# Patient Record
Sex: Female | Born: 1950 | Race: White | Hispanic: No | Smoking: Former smoker
Health system: Southern US, Community
[De-identification: ages and names within clinical notes are randomized; demographics above are authoritative.]

## PROBLEM LIST (undated history)

## (undated) DIAGNOSIS — F329 Major depressive disorder, single episode, unspecified: Secondary | ICD-10-CM

## (undated) DIAGNOSIS — K219 Gastro-esophageal reflux disease without esophagitis: Secondary | ICD-10-CM

## (undated) DIAGNOSIS — I10 Essential (primary) hypertension: Secondary | ICD-10-CM

## (undated) DIAGNOSIS — E119 Type 2 diabetes mellitus without complications: Secondary | ICD-10-CM

## (undated) DIAGNOSIS — F419 Anxiety disorder, unspecified: Secondary | ICD-10-CM

## (undated) DIAGNOSIS — E669 Obesity, unspecified: Secondary | ICD-10-CM

## (undated) DIAGNOSIS — Z720 Tobacco use: Secondary | ICD-10-CM

## (undated) DIAGNOSIS — F32A Depression, unspecified: Secondary | ICD-10-CM

## (undated) DIAGNOSIS — J45909 Unspecified asthma, uncomplicated: Secondary | ICD-10-CM

## (undated) HISTORY — DX: Unspecified asthma, uncomplicated: J45.909

## (undated) HISTORY — DX: Tobacco use: Z72.0

## (undated) HISTORY — DX: Gastro-esophageal reflux disease without esophagitis: K21.9

## (undated) HISTORY — DX: Obesity, unspecified: E66.9

## (undated) HISTORY — DX: Major depressive disorder, single episode, unspecified: F32.9

## (undated) HISTORY — DX: Essential (primary) hypertension: I10

## (undated) HISTORY — DX: Type 2 diabetes mellitus without complications: E11.9

## (undated) HISTORY — PX: TUBAL LIGATION: SHX77

## (undated) HISTORY — DX: Depression, unspecified: F32.A

## (undated) HISTORY — PX: TONSILLECTOMY: SHX5217

---

## 2001-03-20 ENCOUNTER — Other Ambulatory Visit: Admission: RE | Admit: 2001-03-20 | Discharge: 2001-03-20 | Payer: Self-pay | Admitting: Obstetrics and Gynecology

## 2001-05-12 ENCOUNTER — Ambulatory Visit (HOSPITAL_BASED_OUTPATIENT_CLINIC_OR_DEPARTMENT_OTHER): Admission: RE | Admit: 2001-05-12 | Discharge: 2001-05-12 | Payer: Self-pay | Admitting: Internal Medicine

## 2002-06-01 ENCOUNTER — Other Ambulatory Visit: Admission: RE | Admit: 2002-06-01 | Discharge: 2002-06-01 | Payer: Self-pay | Admitting: Obstetrics and Gynecology

## 2003-09-09 ENCOUNTER — Other Ambulatory Visit: Admission: RE | Admit: 2003-09-09 | Discharge: 2003-09-09 | Payer: Self-pay | Admitting: Obstetrics and Gynecology

## 2007-09-18 ENCOUNTER — Encounter: Admission: RE | Admit: 2007-09-18 | Discharge: 2007-09-18 | Payer: Self-pay | Admitting: Internal Medicine

## 2007-10-28 ENCOUNTER — Ambulatory Visit: Payer: Self-pay | Admitting: Internal Medicine

## 2007-11-14 ENCOUNTER — Ambulatory Visit: Payer: Self-pay | Admitting: Internal Medicine

## 2007-11-14 ENCOUNTER — Encounter: Payer: Self-pay | Admitting: Internal Medicine

## 2010-12-05 NOTE — Assessment & Plan Note (Signed)
[facility] OFFICE NOTE   NAME:Enderle, Stepahnie B                       MRN:          [id number]  DATE:10/28/2007                            DOB:          03-22-1951    REASON FOR CONSULTATION:  1. Chronic reflux disease.  2. Colon cancer screening.   HISTORY:  This is a pleasant 60 year old white female with history of  hypertension, diabetes mellitus, and sleep apnea. She is referred  through the courtesy of Dr. Reynaldo Minium regarding the above-listed problems.  The patient reports to me 5-10 year history of problems with heartburn  and indigestion. Initially she took antacids, subsequently H2 receptor  antagonists and most recently proton pump inhibitors. Her symptoms are  daily and bothersome at night when she is on no therapy. She denies  dysphagia. She has had no nausea or vomiting while on medication. She  denies abdominal pain, constipation, or diarrhea. Her bowel habits have  been regular without bleeding. No melena. Appetite and weight have been  stable.   PAST MEDICAL HISTORY:  1. Hypertension.  2. Type 2 diabetes.  3. Sleep apnea.  4. History of panic disorder.   PAST SURGICAL HISTORY:  Tubal ligation.   ALLERGIES:  1. SULFA.  2. CODEINE.   CURRENT MEDICATIONS:  1. Atenolol 15 mg daily.  2. Zoloft 100 mg daily.  3. Mirapex  25 mg daily.  4. Protonix 40 mg daily.  5. Glyburide/Metformin 5/500 mg b.i.d.  6. Aspirin 325 mg daily.   FAMILY HISTORY:  No family history of colon cancer or colon polyps.   SOCIAL HISTORY:  The patient is married without children. She lives with  her husband. 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 She smokes 1/2-pack of cigarettes per day. Does not use  alcohol.   REVIEW OF SYSTEMS:  Eyeglasses, sleeping problems, and muscle pains.  Otherwise, negative review of systems as per diagnostic evaluation form.   PHYSICAL EXAMINATION:   GENERAL:  Somewhat unhealthy-appearing female who  is in no acute distress. She is alert and oriented x3.  VITAL SIGNS:  Blood pressure is 130/84. Heart rate is 60 and regular.  Respirations are 18 and regular. Her weight is 206.6 pounds. She is 5  feet 5 inches in height.  HEENT:  Sclerae are anicteric. Conjunctivae are pink. Oral mucosa is  intact. There is no adenopathy. Thyroid is normal.  LUNGS:  Clear to auscultation and percussion.  HEART:  Regular without murmur.  ABDOMEN:  Obese and soft without tenderness, mass, or hernia. No  organomegaly. Good bowel sounds are heard.  EXTREMITIES:  Without edema.   IMPRESSION:  1. Chronic gastroesophageal reflux disease requiring proton pump      inhibitor therapy for control. This in an obese, caucasian smoker.      Rule out Barrett's esophagus.  2. Colon cancer screening. The patient is an appropriate candidate      without contraindication.   RECOMMENDATIONS:  1. Colonoscopy with polypectomy if necessary and upper endoscopy. The      nature of both procedures as well  as risks, benefits, and      alternatives have been reviewed. She understood and agreed to      proceed.  2. Continue daily proton pump inhibitor therapy.  3. Reflux precautions with attention to weight loss.  4. Discontinue smoking.  5. Ongoing general medical care with Dr. Reynaldo Minium.     Docia Chuck. Henrene Pastor, MD  Electronically Signed    JNP/MedQ  DD: 10/28/2007  DT: 10/28/2007  Job #: [id number]   cc:   Burnard Bunting, M.D.

## 2013-01-09 ENCOUNTER — Ambulatory Visit (INDEPENDENT_AMBULATORY_CARE_PROVIDER_SITE_OTHER): Payer: BC Managed Care – PPO | Admitting: Obstetrics and Gynecology

## 2013-01-09 ENCOUNTER — Encounter: Payer: Self-pay | Admitting: Obstetrics and Gynecology

## 2013-01-09 VITALS — BP 130/76 | HR 76 | Ht 64.0 in | Wt 199.0 lb

## 2013-01-09 DIAGNOSIS — Z1231 Encounter for screening mammogram for malignant neoplasm of breast: Secondary | ICD-10-CM

## 2013-01-09 DIAGNOSIS — Z01419 Encounter for gynecological examination (general) (routine) without abnormal findings: Secondary | ICD-10-CM

## 2013-01-09 NOTE — Progress Notes (Signed)
Patient ID: Amy Herrera, female   DOB: 06/04/1951, 62 y.o.   MRN: [id number] 61 y.o.   Widowed    Caucasian   female   G1P1000   here for annual exam.   LMP 2009.   Having sweats but can deal with it.   No vaginal dryness issues.    No postmenopausal bleeding  Sleep quality not good.  Wakes often during the night.   Has sleep apnea.  Doesn't use CPAP machine.    Some urinary incontinence. Leaks with sneezing.  Wears a pad. Manageable.   Normal bowel function.  No LMP recorded. Patient is postmenopausal.          Sexually active: no  The current method of family planning is none.    Exercising: no Last mammogram: ~10 years  Last pap smear: 2009? History of abnormal pap: no  Smoking: current smoker, 1/2 ppd.  Not ready to stop.   Alcohol: none Last colonoscopy: 11/14/07, polyps, repeat in 5 years Last Bone Density:  10/14/07   Last tetanus shot: 12/31/08 TDap Last cholesterol check: 12/05/12  Hgb:  PCP        Urine:   PCP   Family History  Problem Relation Age of Onset  . Cancer Mother     lung  . Heart attack Father   . Heart disease Sister   . Muscular dystrophy Cousin   . Muscular dystrophy Cousin   . Muscular dystrophy Cousin     There are no active problems to display for this patient.   Past Medical History  Diagnosis Date  . Tobacco abuse   . Obesity   . Asthma   . Depression   . Type II or unspecified type diabetes mellitus without mention of complication, not stated as uncontrolled   . GERD (gastroesophageal reflux disease)   . Hypertension     Past Surgical History  Procedure Laterality Date  . Tubal ligation    . Tonsillectomy      Allergies: Sulfa antibiotics and Codeine  Current Outpatient Prescriptions  Medication Sig Dispense Refill  . amLODipine (NORVASC) 5 MG tablet Take 5 mg by mouth daily.      Marland Kitchen aspirin 325 MG EC tablet Take 325 mg by mouth daily.      Marland Kitchen atenolol (TENORMIN) 50 MG tablet Take 50 mg by mouth daily.      Marland Kitchen atorvastatin  (LIPITOR) 20 MG tablet Take 1 tablet by mouth daily.      . betamethasone dipropionate (DIPROLENE) 0.05 % cream Apply topically 2 (two) times daily.      Marland Kitchen glyBURIDE-metformin (GLUCOVANCE) 5-500 MG per tablet Take 2 tablets by mouth 2 (two) times daily with a meal.      . lisinopril-hydrochlorothiazide (PRINZIDE,ZESTORETIC) 20-12.5 MG per tablet Take 1 tablet by mouth daily.      . pantoprazole (PROTONIX) 40 MG tablet Take 40 mg by mouth daily.      . pramipexole (MIRAPEX) 0.5 MG tablet Take 1 tablet by mouth daily.      . sertraline (ZOLOFT) 100 MG tablet Take 100 mg by mouth daily.       No current facility-administered medications for this visit.    ROS: Pertinent items are noted in HPI.  Social Hx:  Widow. Works at [employer].  Exam:    BP 130/76  Pulse 76  Ht 5\' 4"  (1.626 m)  Wt 199 lb (90.266 kg)  BMI 34.14 kg/m2   Wt Readings from  Last 3 Encounters:  01/09/13 199 lb (90.266 kg)     Ht Readings from Last 3 Encounters:  01/09/13 5\' 4"  (1.626 m)    General appearance: alert, cooperative and appears stated age Head: Normocephalic, without obvious abnormality, atraumatic Neck: no adenopathy, supple, symmetrical, trachea midline and thyroid not enlarged, symmetric, no tenderness/mass/nodules Lungs: clear to auscultation bilaterally Breasts: Inspection negative, No nipple retraction or dimpling, No nipple discharge or bleeding, No axillary or supraclavicular adenopathy, Normal to palpation without dominant masses Heart: regular rate and rhythm Abdomen: obese, soft, non-tender;  no masses,  no organomegaly Extremities: extremities normal, atraumatic, no cyanosis or edema Skin: Skin color, texture, turgor normal. No rashes or lesions Lymph nodes: Cervical, supraclavicular, and axillary nodes normal. No abnormal inguinal nodes palpated Neurologic: Grossly normal   Pelvic: External genitalia:  no lesions              Urethra:  normal appearing  urethra with no masses, tenderness or lesions              Bartholins and Skenes: normal                 Vagina: normal appearing vagina with normal color and discharge, no lesions              Cervix: normal appearance              Pap taken: yes and high risk HPV testing        Bimanual Exam:  Uterus:  uterus is normal size, shape, consistency and nontender                                      Adnexa: normal adnexa in size, nontender and no masses                                      Rectovaginal: Confirms                                      Anus:  normal sphincter tone, no lesions  A: normal menopausal exam     [facility]  Order placed.  Patient will call to make appointment. pap smear and high risk HPV testing. return annually or prn     An After Visit Summary was printed and given to the patient.

## 2013-01-09 NOTE — Patient Instructions (Addendum)

## 2013-01-10 ENCOUNTER — Encounter: Payer: Self-pay | Admitting: Obstetrics and Gynecology

## 2013-01-14 ENCOUNTER — Telehealth: Payer: Self-pay

## 2013-01-14 NOTE — Telephone Encounter (Signed)
Message copied by Lowella Fairy on Wed Jan 14, 2013  1:32 PM ------      Message from: Josefa Half      Created: Wed Jan 14, 2013  1:04 PM       Please note normal result. ------

## 2013-01-14 NOTE — Telephone Encounter (Signed)
LMOVM to call for test results.

## 2013-01-21 NOTE — Telephone Encounter (Signed)
Patient notified pap normal and HPV negative.

## 2013-12-18 ENCOUNTER — Other Ambulatory Visit: Payer: Self-pay | Admitting: Internal Medicine

## 2013-12-18 DIAGNOSIS — Z1231 Encounter for screening mammogram for malignant neoplasm of breast: Secondary | ICD-10-CM

## 2014-01-15 ENCOUNTER — Encounter: Payer: Self-pay | Admitting: Obstetrics and Gynecology

## 2014-01-15 ENCOUNTER — Ambulatory Visit: Payer: BC Managed Care – PPO | Admitting: Obstetrics and Gynecology

## 2014-02-16 ENCOUNTER — Ambulatory Visit: Payer: Self-pay

## 2014-04-19 ENCOUNTER — Encounter: Payer: Self-pay | Admitting: Internal Medicine

## 2014-04-21 ENCOUNTER — Encounter (HOSPITAL_COMMUNITY): Payer: Self-pay | Admitting: Emergency Medicine

## 2014-04-21 ENCOUNTER — Emergency Department (HOSPITAL_COMMUNITY)
Admission: EM | Admit: 2014-04-21 | Discharge: 2014-04-21 | Disposition: A | Discharge status: Home / Self Care | Payer: BC Managed Care – PPO | Attending: Emergency Medicine | Admitting: Emergency Medicine

## 2014-04-21 ENCOUNTER — Emergency Department (HOSPITAL_COMMUNITY): Payer: BC Managed Care – PPO

## 2014-04-21 DIAGNOSIS — Z7982 Long term (current) use of aspirin: Secondary | ICD-10-CM | POA: Diagnosis not present

## 2014-04-21 DIAGNOSIS — F329 Major depressive disorder, single episode, unspecified: Secondary | ICD-10-CM | POA: Insufficient documentation

## 2014-04-21 DIAGNOSIS — Z79899 Other long term (current) drug therapy: Secondary | ICD-10-CM | POA: Diagnosis not present

## 2014-04-21 DIAGNOSIS — R002 Palpitations: Secondary | ICD-10-CM | POA: Insufficient documentation

## 2014-04-21 DIAGNOSIS — K219 Gastro-esophageal reflux disease without esophagitis: Secondary | ICD-10-CM | POA: Diagnosis not present

## 2014-04-21 DIAGNOSIS — F411 Generalized anxiety disorder: Secondary | ICD-10-CM | POA: Diagnosis not present

## 2014-04-21 DIAGNOSIS — I10 Essential (primary) hypertension: Secondary | ICD-10-CM | POA: Diagnosis not present

## 2014-04-21 DIAGNOSIS — F3289 Other specified depressive episodes: Secondary | ICD-10-CM | POA: Diagnosis not present

## 2014-04-21 DIAGNOSIS — R000 Tachycardia, unspecified: Secondary | ICD-10-CM | POA: Insufficient documentation

## 2014-04-21 DIAGNOSIS — R079 Chest pain, unspecified: Secondary | ICD-10-CM | POA: Diagnosis not present

## 2014-04-21 DIAGNOSIS — E860 Dehydration: Secondary | ICD-10-CM

## 2014-04-21 DIAGNOSIS — E119 Type 2 diabetes mellitus without complications: Secondary | ICD-10-CM | POA: Insufficient documentation

## 2014-04-21 DIAGNOSIS — N179 Acute kidney failure, unspecified: Secondary | ICD-10-CM | POA: Diagnosis not present

## 2014-04-21 DIAGNOSIS — E669 Obesity, unspecified: Secondary | ICD-10-CM | POA: Diagnosis not present

## 2014-04-21 DIAGNOSIS — J45901 Unspecified asthma with (acute) exacerbation: Secondary | ICD-10-CM | POA: Insufficient documentation

## 2014-04-21 DIAGNOSIS — F17200 Nicotine dependence, unspecified, uncomplicated: Secondary | ICD-10-CM | POA: Insufficient documentation

## 2014-04-21 DIAGNOSIS — R0602 Shortness of breath: Secondary | ICD-10-CM

## 2014-04-21 HISTORY — DX: Anxiety disorder, unspecified: F41.9

## 2014-04-21 LAB — COMPREHENSIVE METABOLIC PANEL
ALBUMIN: 3.2 g/dL — AB (ref 3.5–5.2)
ALT: 28 U/L (ref 0–35)
ANION GAP: 15 (ref 5–15)
AST: 23 U/L (ref 0–37)
Alkaline Phosphatase: 71 U/L (ref 39–117)
BUN: 28 mg/dL — AB (ref 6–23)
CHLORIDE: 100 meq/L (ref 96–112)
CO2: 21 mEq/L (ref 19–32)
CREATININE: 2.28 mg/dL — AB (ref 0.50–1.10)
Calcium: 9.4 mg/dL (ref 8.4–10.5)
GFR calc Af Amer: 25 mL/min — ABNORMAL LOW (ref >90)
GFR calc non Af Amer: 22 mL/min — ABNORMAL LOW (ref >90)
Glucose, Bld: 302 mg/dL — ABNORMAL HIGH (ref 70–99)
POTASSIUM: 4.3 meq/L (ref 3.7–5.3)
Sodium: 136 mEq/L — ABNORMAL LOW (ref 137–147)
TOTAL PROTEIN: 7.1 g/dL (ref 6.0–8.3)
Total Bilirubin: <0.2 mg/dL — ABNORMAL LOW (ref 0.3–1.2)

## 2014-04-21 LAB — CBC WITH DIFFERENTIAL/PLATELET
BASOS ABS: 0.1 10*3/uL (ref 0.0–0.1)
BASOS PCT: 1 % (ref 0–1)
EOS ABS: 0.2 10*3/uL (ref 0.0–0.7)
Eosinophils Relative: 2 % (ref 0–5)
HCT: 34.4 % — ABNORMAL LOW (ref 36.0–46.0)
HEMOGLOBIN: 11.3 g/dL — AB (ref 12.0–15.0)
Lymphocytes Relative: 27 % (ref 12–46)
Lymphs Abs: 1.9 10*3/uL (ref 0.7–4.0)
MCH: 28.2 pg (ref 26.0–34.0)
MCHC: 32.8 g/dL (ref 30.0–36.0)
MCV: 85.8 fL (ref 78.0–100.0)
MONO ABS: 0.3 10*3/uL (ref 0.1–1.0)
MONOS PCT: 5 % (ref 3–12)
NEUTROS ABS: 4.7 10*3/uL (ref 1.7–7.7)
NEUTROS PCT: 65 % (ref 43–77)
Platelets: 285 10*3/uL (ref 150–400)
RBC: 4.01 MIL/uL (ref 3.87–5.11)
RDW: 14.7 % (ref 11.5–15.5)
WBC: 7.1 10*3/uL (ref 4.0–10.5)

## 2014-04-21 LAB — I-STAT TROPONIN, ED
TROPONIN I, POC: 0.01 ng/mL (ref 0.00–0.08)
TROPONIN I, POC: 0.02 ng/mL (ref 0.00–0.08)

## 2014-04-21 LAB — D-DIMER, QUANTITATIVE (NOT AT ARMC): D DIMER QUANT: 0.4 ug{FEU}/mL (ref 0.00–0.48)

## 2014-04-21 LAB — PRO B NATRIURETIC PEPTIDE: PRO B NATRI PEPTIDE: 177.2 pg/mL — AB (ref 0–125)

## 2014-04-21 MED ORDER — SODIUM CHLORIDE 0.9 % IV BOLUS (SEPSIS)
1000.0000 mL | Freq: Once | INTRAVENOUS | Status: AC
  Administered 2014-04-21: 1000 mL via INTRAVENOUS

## 2014-04-21 NOTE — Discharge Instructions (Signed)
Stay hydrated.   Follow up with Dr. Joya Salm. Repeat BMp in a week.   Return to ER if you have vomiting, shortness of breath, chest pain, worse dehydration.

## 2014-04-21 NOTE — ED Notes (Signed)
Pt given Ginger Ale.  

## 2014-04-21 NOTE — ED Notes (Signed)
IV d/c per RN.

## 2014-04-21 NOTE — ED Provider Notes (Signed)
CSN: [id number]     Arrival date & time 04/21/14  [id number] History   First MD Initiated Contact with Patient 04/21/14 [phone]     Chief Complaint  Patient presents with  . Shortness of Breath  . Palpitations     (Consider location/radiation/quality/duration/timing/severity/associated sxs/prior Treatment) The history is provided by the patient.  SHER COLAW is a 63 y.o. female hx of DM, smoker, asthma here with palpitations, shortness of breath. Has been coughing for the last 4-5 days, prescribed Zpack by PMD. Woke up this morning and had some palpitations and shortness of breath. Felt like she couldn't catch her breath. Some intermittent sharp, left sided chest pain as well that resolved in arrival. Has nausea, no vomiting. No fever or cough. No hx of PE or recent travels.    Past Medical History  Diagnosis Date  . Tobacco abuse   . Obesity   . Asthma   . Depression   . Type II or unspecified type diabetes mellitus without mention of complication, not stated as uncontrolled   . GERD (gastroesophageal reflux disease)   . Hypertension   . Anxiety    Past Surgical History  Procedure Laterality Date  . Tubal ligation    . Tonsillectomy     Family History  Problem Relation Age of Onset  . Cancer Mother     lung  . Heart attack Father   . Heart disease Sister   . Muscular dystrophy Cousin   . Muscular dystrophy Cousin   . Muscular dystrophy Cousin    History  Substance Use Topics  . Smoking status: Current Every Day Smoker -- 0.50 packs/day for 40 years    Types: Cigarettes  . Smokeless tobacco: Never Used  . Alcohol Use: No   OB History   Grav Para Term Preterm Abortions TAB SAB Ect Mult Living   1 1 1             Review of Systems  Respiratory: Positive for shortness of breath.   Cardiovascular: Positive for chest pain and palpitations.  All other systems reviewed and are negative.     Allergies  Sulfa antibiotics and Codeine  Home Medications   Prior to  Admission medications   Medication Sig Start Date End Date Taking? Authorizing Provider  amLODipine (NORVASC) 5 MG tablet Take 5 mg by mouth daily.   Yes Historical Provider, MD  aspirin 325 MG EC tablet Take 325 mg by mouth at bedtime.    Yes Historical Provider, MD  atenolol (TENORMIN) 50 MG tablet Take 50 mg by mouth at bedtime.    Yes Historical Provider, MD  atorvastatin (LIPITOR) 20 MG tablet Take 20 mg by mouth at bedtime.  12/05/12  Yes Historical Provider, MD  glyBURIDE-metformin (GLUCOVANCE) 5-500 MG per tablet Take 2 tablets by mouth 2 (two) times daily with a meal.   Yes Historical Provider, MD  lisinopril-hydrochlorothiazide (PRINZIDE,ZESTORETIC) 20-12.5 MG per tablet Take 1 tablet by mouth 2 (two) times daily.    Yes Historical Provider, MD  pantoprazole (PROTONIX) 40 MG tablet Take 40 mg by mouth at bedtime.    Yes Historical Provider, MD  pramipexole (MIRAPEX) 0.5 MG tablet Take 0.5 mg by mouth at bedtime.  12/18/12  Yes Historical Provider, MD  sertraline (ZOLOFT) 100 MG tablet Take 100 mg by mouth at bedtime.    Yes Historical Provider, MD   BP 145/74  Pulse 93  Temp(Src) 99.5 F (37.5 C)  Resp 18  SpO2 100% Physical Exam  Nursing  note and vitals reviewed. Constitutional: She is oriented to person, place, and time. She appears well-developed and well-nourished.  HENT:  Head: Normocephalic.  Mouth/Throat: Oropharynx is clear and moist.  Eyes: Conjunctivae are normal. Pupils are equal, round, and reactive to light.  Neck: Normal range of motion. Neck supple.  Cardiovascular: Regular rhythm and normal heart sounds.   Tachy   Pulmonary/Chest: Effort normal and breath sounds normal. No respiratory distress. She has no wheezes. She has no rales.  Abdominal: Soft. Bowel sounds are normal. She exhibits no distension. There is no tenderness. There is no rebound.  Musculoskeletal: Normal range of motion. She exhibits no edema and no tenderness.  Neurological: She is alert and  oriented to person, place, and time. No cranial nerve deficit. Coordination normal.  Skin: Skin is warm and dry.  Psychiatric: She has a normal mood and affect. Her behavior is normal. Judgment and thought content normal.    ED Course  Procedures (including critical care time) Labs Review Labs Reviewed  CBC WITH DIFFERENTIAL - Abnormal; Notable for the following:    Hemoglobin 11.3 (*)    HCT 34.4 (*)    All other components within normal limits  COMPREHENSIVE METABOLIC PANEL - Abnormal; Notable for the following:    Sodium 136 (*)    Glucose, Bld 302 (*)    BUN 28 (*)    Creatinine, Ser 2.28 (*)    Albumin 3.2 (*)    Total Bilirubin <0.2 (*)    GFR calc non Af Amer 22 (*)    GFR calc Af Amer 25 (*)    All other components within normal limits  PRO B NATRIURETIC PEPTIDE - Abnormal; Notable for the following:    Pro B Natriuretic peptide (BNP) 177.2 (*)    All other components within normal limits  D-DIMER, QUANTITATIVE  I-STAT TROPOININ, ED  Randolm Idol, ED    Imaging Review Dg Chest 2 View  04/21/2014   CLINICAL DATA:  Congestion and weakness.  EXAM: CHEST  2 VIEW  COMPARISON:  None.  FINDINGS: Midline trachea.  Normal heart size and mediastinal contours.  Sharp costophrenic angles.  No pneumothorax.  Clear lungs.  Moderate thoracic spondylosis.  IMPRESSION: No active cardiopulmonary disease.   Electronically Signed   By: Abigail Miyamoto M.D.   On: 04/21/2014 09:32     EKG Interpretation   Date/Time:  Wednesday April 21 2014 08:31:34 EDT Ventricular Rate:  103 PR Interval:  125 QRS Duration: 84 QT Interval:  341 QTC Calculation: 446 R Axis:   32 Text Interpretation:  Sinus tachycardia Probable LVH with secondary repol  abnrm No previous ECGs available Confirmed by Shannan Slinker  MD, Bard Haupert ([id number]) on  04/21/2014 8:34:04 AM      MDM   Final diagnoses:  None    NYOKA CLIPPARD is a 63 y.o. female here with palpitations, shortness of breath. Consider ACS vs PE. Will  get trop x 2, d-dimer. Will check labs, orthostatics, hydrate and reassess.   2:30 PM Trop neg x 2. Cr. 2.2, baseline 1.8. Given IVF. D-dimer neg. CXR clear. I called Dr. Joya Salm, who knows patient very well. He will have patient f/u with him in office.      Wandra Arthurs, MD 04/21/14 [phone]

## 2014-04-21 NOTE — ED Notes (Signed)
Pt from work via [employer] with c/o palpitations and shortness of breath starting this morning at 530 am with nausea after eating breakfast.  Pt states she was started on a z-pack for possibly bronchitis.  Started having intermittent sharp substernal chest pain on the ride here. Pt in NAD, A&O.

## 2014-05-24 ENCOUNTER — Encounter (HOSPITAL_COMMUNITY): Payer: Self-pay | Admitting: Emergency Medicine

## 2014-08-10 ENCOUNTER — Encounter: Payer: Self-pay | Admitting: Obstetrics and Gynecology

## 2015-05-28 ENCOUNTER — Other Ambulatory Visit: Payer: Self-pay

## 2015-05-28 DIAGNOSIS — Z1231 Encounter for screening mammogram for malignant neoplasm of breast: Secondary | ICD-10-CM

## 2015-06-23 ENCOUNTER — Other Ambulatory Visit: Payer: Self-pay | Admitting: Internal Medicine

## 2015-06-23 DIAGNOSIS — Z1231 Encounter for screening mammogram for malignant neoplasm of breast: Secondary | ICD-10-CM

## 2015-07-15 ENCOUNTER — Ambulatory Visit
Admission: RE | Admit: 2015-07-15 | Discharge: 2015-07-15 | Disposition: A | Discharge status: Home / Self Care | Payer: BLUE CROSS/BLUE SHIELD | Source: Ambulatory Visit | Attending: Internal Medicine | Admitting: Internal Medicine

## 2015-07-15 DIAGNOSIS — Z1231 Encounter for screening mammogram for malignant neoplasm of breast: Secondary | ICD-10-CM

## 2015-07-20 ENCOUNTER — Other Ambulatory Visit: Payer: Self-pay | Admitting: Internal Medicine

## 2015-07-20 DIAGNOSIS — R928 Other abnormal and inconclusive findings on diagnostic imaging of breast: Secondary | ICD-10-CM

## 2015-08-30 ENCOUNTER — Ambulatory Visit
Admission: RE | Admit: 2015-08-30 | Discharge: 2015-08-30 | Disposition: A | Discharge status: Home / Self Care | Payer: BLUE CROSS/BLUE SHIELD | Source: Ambulatory Visit | Attending: Internal Medicine | Admitting: Internal Medicine

## 2015-08-30 DIAGNOSIS — R928 Other abnormal and inconclusive findings on diagnostic imaging of breast: Secondary | ICD-10-CM

## 2016-04-10 ENCOUNTER — Other Ambulatory Visit: Payer: Self-pay | Admitting: *Deleted

## 2016-04-10 DIAGNOSIS — Z01810 Encounter for preprocedural cardiovascular examination: Secondary | ICD-10-CM

## 2016-04-10 DIAGNOSIS — N184 Chronic kidney disease, stage 4 (severe): Secondary | ICD-10-CM

## 2016-04-24 ENCOUNTER — Encounter: Payer: Self-pay | Admitting: Vascular Surgery

## 2016-04-30 ENCOUNTER — Ambulatory Visit (INDEPENDENT_AMBULATORY_CARE_PROVIDER_SITE_OTHER): Payer: BLUE CROSS/BLUE SHIELD | Admitting: Vascular Surgery

## 2016-04-30 ENCOUNTER — Ambulatory Visit (INDEPENDENT_AMBULATORY_CARE_PROVIDER_SITE_OTHER)
Admission: RE | Admit: 2016-04-30 | Discharge: 2016-04-30 | Disposition: A | Discharge status: Home / Self Care | Payer: BLUE CROSS/BLUE SHIELD | Source: Ambulatory Visit | Attending: Vascular Surgery | Admitting: Vascular Surgery

## 2016-04-30 ENCOUNTER — Ambulatory Visit (HOSPITAL_COMMUNITY)
Admission: RE | Admit: 2016-04-30 | Discharge: 2016-04-30 | Disposition: A | Discharge status: Home / Self Care | Payer: BLUE CROSS/BLUE SHIELD | Source: Ambulatory Visit | Attending: Vascular Surgery | Admitting: Vascular Surgery

## 2016-04-30 ENCOUNTER — Encounter: Payer: Self-pay | Admitting: Vascular Surgery

## 2016-04-30 VITALS — BP 132/73 | HR 85 | Temp 99.4°F | Resp 18 | Ht 64.0 in | Wt 188.0 lb

## 2016-04-30 DIAGNOSIS — Z01810 Encounter for preprocedural cardiovascular examination: Secondary | ICD-10-CM | POA: Diagnosis not present

## 2016-04-30 DIAGNOSIS — N184 Chronic kidney disease, stage 4 (severe): Secondary | ICD-10-CM

## 2016-04-30 NOTE — Progress Notes (Signed)
Referred by:  Burnard Bunting, MD [address] 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  The patient is right hand dominant.  The patient has not had previous access procedures.  Previous central venous cannulation procedures include: none.  The patient has never had a PPM placed.   Past Medical History:  Diagnosis Date  . Anxiety   . Asthma   . Depression   . GERD (gastroesophageal reflux disease)   . Hypertension   . Obesity   . Tobacco abuse   . Type II or unspecified type diabetes mellitus without mention of complication, not stated as uncontrolled     Past Surgical History:  Procedure Laterality Date  . TONSILLECTOMY    . TUBAL LIGATION      Social History   Social History  . Marital status: Widowed    Spouse name: N/A  . Number of children: N/A  . Years of education: N/A   Occupational History  . Not on file.   Social History Main Topics  . Smoking status: Former Smoker    Packs/day: 0.50    Years: 40.00    Types: Cigarettes    Quit date: 10/30/2015  . Smokeless tobacco: Never Used  . Alcohol use No  . Drug use: No  . Sexual activity: Not Currently    Partners: Male   Other Topics Concern  . Not on file   Social History Narrative  . No narrative on file    Family History  Problem Relation Age of Onset  . Cancer Mother     lung  . Heart attack Father   . Heart disease Father   . Heart disease Sister   . Muscular dystrophy Cousin   . Muscular dystrophy Cousin   . Muscular dystrophy Cousin     Current Outpatient Prescriptions  Medication Sig Dispense Refill  . amLODipine (NORVASC) 5 MG tablet Take 5 mg by mouth daily.    Marland Kitchen aspirin 325 MG EC tablet Take 325 mg by mouth at bedtime.     Marland Kitchen atenolol (TENORMIN) 50 MG tablet Take 50 mg by mouth at bedtime.     Marland Kitchen atorvastatin (LIPITOR)  20 MG tablet Take 20 mg by mouth at bedtime.     . furosemide (LASIX) 80 MG tablet Take 40 mg by mouth daily as needed.    . glyBURIDE-metformin (GLUCOVANCE) 5-500 MG per tablet Take 2 tablets by mouth 2 (two) times daily with a meal.    . lisinopril-hydrochlorothiazide (PRINZIDE,ZESTORETIC) 20-12.5 MG per tablet Take 1 tablet by mouth 2 (two) times daily.     . pantoprazole (PROTONIX) 40 MG tablet Take 40 mg by mouth at bedtime.     . pramipexole (MIRAPEX) 0.5 MG tablet Take 0.5 mg by mouth at bedtime.     . sertraline (ZOLOFT) 100 MG tablet Take 100 mg by mouth at bedtime.      No current facility-administered medications for this visit.     Allergies  Allergen Reactions  . Sulfa Antibiotics Swelling    Face/mouth  . Codeine Hives    REVIEW OF SYSTEMS:   Cardiac:  positive for: no symptoms, negative for: Chest pain or chest pressure, Shortness of breath upon exertion and Shortness of breath when lying flat,   Vascular:  positive for: no symptoms,  negative for: Pain in calf, thigh,  or hip brought on by ambulation, Pain in feet at night that wakes you up from your sleep, Blood clot in your veins and Leg swelling  Pulmonary:  positive for: no symptoms,  negative for: Oxygen at home, Productive cough and Wheezing  Neurologic:  positive for: Problems with dizziness, negative for: Sudden weakness in arms or legs, Sudden numbness in arms or legs, Sudden onset of difficulty speaking or slurred speech and Temporary loss of vision in one eye  Gastrointestinal:  positive for: no symptoms, negative for: Blood in stool and Vomited blood  Genitourinary:  positive for: no symptoms, negative for: Burning when urinating and Blood in urine  Psychiatric:  positive for: no symptoms,  negative for: Major depression  Hematologic:  positive for: no symptoms,  negative for: negative for: Bleeding problems and Problems with blood clotting too easily  Dermatologic:  positive for: no  symptoms, negative for: Rashes or ulcers  Constitutional:  positive for: no symptoms, negative for: Fever or chills  Ear/Nose/Throat:  positive for: no symptoms, negative for: Change in hearing, Nose bleeds and Sore throat  Musculoskeletal:  positive for: no symptoms, negative for: Back pain, Joint pain and Muscle pain   Physical Examination  Vitals:   04/30/16 0842  BP: 132/73  Pulse: 85  Resp: 18  Temp: 99.4 F (37.4 C)  TempSrc: Oral  SpO2: 96%  Weight: 188 lb (85.3 kg)  Height: 5\' 4"  (1.626 m)    Body mass index is 32.27 kg/m.  General: Alert, O x 3, WD,NAD  Head: [place]/AT,   Ear/Nose/Throat: Hearing grossly intact, nares without erythema or drainage, oropharynx without Erythema or Exudate , Mallampati score: 3, Dentition intact  Eyes: PERRLA, EOMI,   Neck: Supple, mid-line trachea,    Pulmonary: Sym exp, good B air movt,CTA B  Cardiac: RRR, Nl S1, S2, no Murmurs, No rubs, No S3,S4  Vascular: Vessel Right Left  Radial Palpable Palpable  Brachial Palpable Palpable  Carotid Palpable, No Bruit Palpable, No Bruit  Aorta Not palpable N/A  Femoral Palpable Palpable  Popliteal Not palpable Not palpable  PT Palpable Palpable  DP Palpable Palpable   Gastrointestinal: soft, non-distended, non-tender to palpation, No guarding or rebound, no HSM, no masses, no CVAT B, No palpable prominent aortic pulse,    Musculoskeletal: M/S 5/5 throughout  , Extremities without ischemic changes  , No edema present,  , No LDS present  Neurologic: CN 2-12 intact , Pain and light touch intact in extremities , Motor exam as listed above  Psychiatric: Judgement intact, Mood & affect appropriate for pt's clinical situation  Dermatologic: See M/S exam for extremity exam, No rashes otherwise noted  Lymph : Palpable lymph nodes: None   Non-Invasive Vascular Imaging  Vein Mapping  (Date: 04/30/2016):   R arm: acceptable vein conduits include all veins  L arm: acceptable vein  conduits include all veins  BUE Doppler (Date: 04/30/2016):   R arm:   Brachial: tri, 5.1 mm  Radial: tri, 2.6 mm  Ulnar: tri, 1.9 mm  L arm:   Brachial: tri, 4.8 mm  Radial: tri, 2.3 mm  Ulnar: tri, 2.3 mm  Outside Studies/Documentation 3 pages of outside documents were reviewed including: outpatient nephrology chart.   Medical Decision Making  Amy Herrera is a 65 y.o. female who presents with ESRD requiring hemodialysis.    Based on vein mapping and examination, this patient's permanent access options include: all fistula options.  I would start with a L RC vs BC AVF.  I had an extensive discussion with this patient in regards to the nature of access surgery, including risk, benefits, and alternatives.    The patient is aware that the risks of access surgery include but are not limited to: bleeding, infection, steal syndrome, nerve damage, ischemic monomelic neuropathy, failure of access to mature, and possible need for additional access procedures in the future.  The patient has NOT agreed to proceed with the above procedure.  She will contact us when she is ready to proceed.   Adele Barthel, MD, FACS [facility] Office: [phone] Pager: [phone]  04/30/2016, 9:19 AM

## 2017-11-27 ENCOUNTER — Encounter: Payer: Self-pay | Admitting: Internal Medicine

## 2018-01-23 ENCOUNTER — Emergency Department (HOSPITAL_COMMUNITY)
Admission: EM | Admit: 2018-01-23 | Discharge: 2018-01-23 | Disposition: A | Discharge status: Home / Self Care | Payer: Self-pay | Attending: Emergency Medicine | Admitting: Emergency Medicine

## 2018-01-23 ENCOUNTER — Emergency Department (HOSPITAL_COMMUNITY): Payer: Self-pay

## 2018-01-23 ENCOUNTER — Emergency Department (HOSPITAL_COMMUNITY)
Admission: EM | Admit: 2018-01-23 | Discharge: 2018-01-23 | Disposition: A | Discharge status: Home / Self Care | Payer: BLUE CROSS/BLUE SHIELD | Attending: Emergency Medicine | Admitting: Emergency Medicine

## 2018-01-23 ENCOUNTER — Encounter (HOSPITAL_COMMUNITY): Payer: Self-pay | Admitting: Emergency Medicine

## 2018-01-23 ENCOUNTER — Other Ambulatory Visit: Payer: Self-pay

## 2018-01-23 DIAGNOSIS — J45909 Unspecified asthma, uncomplicated: Secondary | ICD-10-CM | POA: Insufficient documentation

## 2018-01-23 DIAGNOSIS — E119 Type 2 diabetes mellitus without complications: Secondary | ICD-10-CM | POA: Insufficient documentation

## 2018-01-23 DIAGNOSIS — S0083XA Contusion of other part of head, initial encounter: Secondary | ICD-10-CM | POA: Insufficient documentation

## 2018-01-23 DIAGNOSIS — Z87891 Personal history of nicotine dependence: Secondary | ICD-10-CM | POA: Insufficient documentation

## 2018-01-23 DIAGNOSIS — Y939 Activity, unspecified: Secondary | ICD-10-CM | POA: Insufficient documentation

## 2018-01-23 DIAGNOSIS — G2581 Restless legs syndrome: Secondary | ICD-10-CM | POA: Insufficient documentation

## 2018-01-23 DIAGNOSIS — F419 Anxiety disorder, unspecified: Secondary | ICD-10-CM | POA: Insufficient documentation

## 2018-01-23 DIAGNOSIS — Y929 Unspecified place or not applicable: Secondary | ICD-10-CM | POA: Insufficient documentation

## 2018-01-23 DIAGNOSIS — W19XXXA Unspecified fall, initial encounter: Secondary | ICD-10-CM | POA: Insufficient documentation

## 2018-01-23 DIAGNOSIS — Y999 Unspecified external cause status: Secondary | ICD-10-CM | POA: Insufficient documentation

## 2018-01-23 DIAGNOSIS — Z7982 Long term (current) use of aspirin: Secondary | ICD-10-CM | POA: Insufficient documentation

## 2018-01-23 DIAGNOSIS — R55 Syncope and collapse: Secondary | ICD-10-CM

## 2018-01-23 DIAGNOSIS — Z7984 Long term (current) use of oral hypoglycemic drugs: Secondary | ICD-10-CM | POA: Insufficient documentation

## 2018-01-23 DIAGNOSIS — I10 Essential (primary) hypertension: Secondary | ICD-10-CM | POA: Insufficient documentation

## 2018-01-23 DIAGNOSIS — Z79899 Other long term (current) drug therapy: Secondary | ICD-10-CM | POA: Insufficient documentation

## 2018-01-23 DIAGNOSIS — T50905A Adverse effect of unspecified drugs, medicaments and biological substances, initial encounter: Secondary | ICD-10-CM

## 2018-01-23 LAB — URINALYSIS, ROUTINE W REFLEX MICROSCOPIC
Bilirubin Urine: NEGATIVE
Glucose, UA: 50 mg/dL — AB
Hgb urine dipstick: NEGATIVE
KETONES UR: NEGATIVE mg/dL
NITRITE: NEGATIVE
PH: 5 (ref 5.0–8.0)
PROTEIN: 100 mg/dL — AB
Specific Gravity, Urine: 1.013 (ref 1.005–1.030)

## 2018-01-23 LAB — BASIC METABOLIC PANEL
ANION GAP: 10 (ref 5–15)
BUN: 80 mg/dL — AB (ref 8–23)
CHLORIDE: 111 mmol/L (ref 98–111)
CO2: 16 mmol/L — ABNORMAL LOW (ref 22–32)
Calcium: 8.6 mg/dL — ABNORMAL LOW (ref 8.9–10.3)
Creatinine, Ser: 3.11 mg/dL — ABNORMAL HIGH (ref 0.44–1.00)
GFR calc Af Amer: 17 mL/min — ABNORMAL LOW (ref >60)
GFR, EST NON AFRICAN AMERICAN: 15 mL/min — AB (ref >60)
Glucose, Bld: 211 mg/dL — ABNORMAL HIGH (ref 70–99)
POTASSIUM: 5 mmol/L (ref 3.5–5.1)
Sodium: 137 mmol/L (ref 135–145)

## 2018-01-23 LAB — CBC
HEMATOCRIT: 29 % — AB (ref 36.0–46.0)
HEMOGLOBIN: 8.8 g/dL — AB (ref 12.0–15.0)
MCH: 26.6 pg (ref 26.0–34.0)
MCHC: 30.3 g/dL (ref 30.0–36.0)
MCV: 87.6 fL (ref 78.0–100.0)
Platelets: 253 10*3/uL (ref 150–400)
RBC: 3.31 MIL/uL — ABNORMAL LOW (ref 3.87–5.11)
RDW: 16.6 % — ABNORMAL HIGH (ref 11.5–15.5)
WBC: 7.8 10*3/uL (ref 4.0–10.5)

## 2018-01-23 LAB — I-STAT VENOUS BLOOD GAS, ED
Acid-base deficit: 12 mmol/L — ABNORMAL HIGH (ref 0.0–2.0)
Bicarbonate: 13.6 mmol/L — ABNORMAL LOW (ref 20.0–28.0)
O2 Saturation: 92 %
PCO2 VEN: 29.1 mmHg — AB (ref 44.0–60.0)
PH VEN: 7.277 (ref 7.250–7.430)
TCO2: 14 mmol/L — AB (ref 22–32)
pO2, Ven: 70 mmHg — ABNORMAL HIGH (ref 32.0–45.0)

## 2018-01-23 LAB — CK: Total CK: 686 U/L — ABNORMAL HIGH (ref 38–234)

## 2018-01-23 LAB — CBG MONITORING, ED: GLUCOSE-CAPILLARY: 239 mg/dL — AB (ref 70–99)

## 2018-01-23 MED ORDER — SERTRALINE HCL 100 MG PO TABS: 100.0000 mg | ORAL_TABLET | Freq: Every day | ORAL | 0 refills | Status: DC

## 2018-01-23 MED ORDER — PRAMIPEXOLE DIHYDROCHLORIDE 0.25 MG PO TABS: 0.5000 mg | ORAL_TABLET | Freq: Once | ORAL | Status: DC

## 2018-01-23 MED ORDER — PRAMIPEXOLE DIHYDROCHLORIDE 0.5 MG PO TABS: 0.5000 mg | ORAL_TABLET | Freq: Every day | ORAL | 0 refills | Status: DC

## 2018-01-23 MED ORDER — LORAZEPAM 1 MG PO TABS
2.0000 mg | ORAL_TABLET | Freq: Once | ORAL | Status: AC
  Administered 2018-01-23: 2 mg via ORAL
  Filled 2018-01-23: qty 2

## 2018-01-23 MED ORDER — PRAMIPEXOLE DIHYDROCHLORIDE 1 MG PO TABS
1.0000 mg | ORAL_TABLET | Freq: Once | ORAL | Status: AC
  Administered 2018-01-23: 1 mg via ORAL
  Filled 2018-01-23: qty 1

## 2018-01-23 NOTE — ED Notes (Signed)
Patient very restless moving around stretcher, can't stay still.

## 2018-01-23 NOTE — Discharge Instructions (Addendum)
Resume your Zoloft and Mirapex as previously prescribed.  Follow-up with your primary doctor in the next 2 to 3 days if symptoms are not improving.

## 2018-01-23 NOTE — ED Provider Notes (Signed)
[facility] EMERGENCY DEPARTMENT Provider Note   CSN: [id number] Arrival date & time: 01/23/18  0319     History   Chief Complaint Chief Complaint  Patient presents with  . Insomnia  . Shortness of Breath    HPI Amy Herrera is a 67 y.o. female.  Patient is a 67 year old female with past medical history of anxiety, depression, hypertension, and restless legs.  She presents with complaints of anxiety, dyspnea, and worsening of her restless legs for the past 3 days.  She states that she has not slept during this period of time due to this.  She ran out of her Mirapex approximately the time the symptoms began.  The history is provided by the patient.  Insomnia  This is a recurrent problem. Episode onset: 3 days ago. The problem occurs constantly. The problem has been gradually worsening. Associated symptoms include shortness of breath. Nothing aggravates the symptoms. Nothing relieves the symptoms. She has tried nothing for the symptoms.  Shortness of Breath     Past Medical History:  Diagnosis Date  . Anxiety   . Asthma   . Depression   . GERD (gastroesophageal reflux disease)   . Hypertension   . Obesity   . Tobacco abuse   . Type II or unspecified type diabetes mellitus without mention of complication, not stated as uncontrolled     There are no active problems to display for this patient.   Past Surgical History:  Procedure Laterality Date  . TONSILLECTOMY    . TUBAL LIGATION       OB History    Gravida  1   Para  1   Term  1   Preterm      AB      Living        SAB      TAB      Ectopic      Multiple      Live Births               Home Medications    Prior to Admission medications   Medication Sig Start Date End Date Taking? Authorizing Provider  amLODipine (NORVASC) 5 MG tablet Take 5 mg by mouth daily.    [provider]  aspirin 325 MG EC tablet Take 325 mg by mouth at bedtime.     [provider]  atenolol (TENORMIN) 50 MG tablet Take 50 mg by mouth at bedtime.     [provider]  atorvastatin (LIPITOR) 20 MG tablet Take 20 mg by mouth at bedtime.  12/05/12   [provider]  furosemide (LASIX) 80 MG tablet Take 40 mg by mouth daily as needed.    [provider]  glyBURIDE-metformin (GLUCOVANCE) 5-500 MG per tablet Take 2 tablets by mouth 2 (two) times daily with a meal.    [provider]  lisinopril-hydrochlorothiazide (PRINZIDE,ZESTORETIC) 20-12.5 MG per tablet Take 1 tablet by mouth 2 (two) times daily.     [provider]  pantoprazole (PROTONIX) 40 MG tablet Take 40 mg by mouth at bedtime.     [provider]  pramipexole (MIRAPEX) 0.5 MG tablet Take 0.5 mg by mouth at bedtime.  12/18/12   [provider]  sertraline (ZOLOFT) 100 MG tablet Take 100 mg by mouth at bedtime.     [provider]    Family History Family History  Problem Relation Age of Onset  . Cancer Mother  lung  . Heart attack Father   . Heart disease Father   . Heart disease Sister   . Muscular dystrophy Cousin   . Muscular dystrophy Cousin   . Muscular dystrophy Cousin     Social History Social History   Tobacco Use  . Smoking status: Former Smoker    Packs/day: 0.50    Years: 40.00    Pack years: 20.00    Types: Cigarettes    Last attempt to quit: 10/30/2015    Years since quitting: 2.2  . Smokeless tobacco: Never Used  Substance Use Topics  . Alcohol use: No  . Drug use: No     Allergies   Sulfa antibiotics and Codeine   Review of Systems Review of Systems  Respiratory: Positive for shortness of breath.   Psychiatric/Behavioral: The patient has insomnia.   All other systems reviewed and are negative.    Physical Exam Updated Vital Signs BP 100/87   Pulse 86   Temp 98.7 F (37.1 C) (Oral)   Resp 16   Ht 5\' 5"  (1.651 m)   Wt 83.9 kg (185 lb)   SpO2 99%   BMI 30.79 kg/m   Physical  Exam  Constitutional: She is oriented to person, place, and time. She appears well-developed and well-nourished. No distress.  Appears anxious  HENT:  Head: Normocephalic and atraumatic.  Neck: Normal range of motion. Neck supple.  Cardiovascular: Normal rate and regular rhythm. Exam reveals no gallop and no friction rub.  No murmur heard. Pulmonary/Chest: Effort normal and breath sounds normal. No respiratory distress. She has no wheezes.  Abdominal: Soft. Bowel sounds are normal. She exhibits no distension. There is no tenderness.  Musculoskeletal: Normal range of motion.       Right lower leg: Normal. She exhibits no tenderness and no edema.       Left lower leg: Normal. She exhibits no tenderness and no edema.  Neurological: She is alert and oriented to person, place, and time.  Skin: Skin is warm and dry. She is not diaphoretic.  Nursing note and vitals reviewed.    ED Treatments / Results  Labs (all labs ordered are listed, but only abnormal results are displayed) Labs Reviewed  CBC - Abnormal; Notable for the following components:      Result Value   RBC 3.31 (*)    Hemoglobin 8.8 (*)    HCT 29.0 (*)    RDW 16.6 (*)    All other components within normal limits  BASIC METABOLIC PANEL    EKG None  Radiology No results found.  Procedures Procedures (including critical care time)  Medications Ordered in ED Medications  LORazepam (ATIVAN) tablet 2 mg (has no administration in time range)     Initial Impression / Assessment and Plan / ED Course  I have reviewed the triage vital signs and the nursing notes.  Pertinent labs & imaging results that were available during my care of the patient were reviewed by me and considered in my medical decision making (see chart for details).  Patient presents here extremely anxious and restless.  She has a history of restless legs and ran out of her Zoloft and Mirapex 3 days ago just prior to the onset of symptoms.  I highly  suspect that this is the cause of this exacerbation.  She was given Ativan with some improvement.  She continues to be restless and anxious and I believe starting her back on her medicines as the most appropriate course of  action.  Her laboratory studies show a creatinine of 3.1 which is pretty much consistent with her baseline.  She is to follow-up with her primary doctor.  Final Clinical Impressions(s) / ED Diagnoses   Final diagnoses:  None    ED Discharge Orders    None       Veryl Speak, MD 01/23/18 [phone]

## 2018-01-23 NOTE — ED Triage Notes (Signed)
Patient d/c from ed and was waiting for family to pick her up. Patient had a witnessed fall in the lobby. Staff reported patient was very unsteady and fell hitting her left face and left ankle. Patient a/ox4. No loc reported.

## 2018-01-23 NOTE — ED Notes (Signed)
Patient transported to CT 

## 2018-01-23 NOTE — ED Provider Notes (Signed)
[facility] EM[facility] DEPARTMENT Provider Note   CSN: [id number] Arrival date & time: 01/23/18  0709     History   Chief Complaint Chief Complaint  Patient presents with  . Fall    HPI Amy Herrera is a 67 y.o. female.  The history is provided by the patient. No language interpreter was used.  Fall     Amy Herrera is a 67 y.o. female who presents to the Emergency Department complaining of fall. She is brought back to an ED room for evaluation following a fall. She presented to the ED earlier tonight for restless leg. She has been out of her Mirapex for the last three days as well as her iron supplement. She is unable to explain why she ran out of these medicines. She states for three days that she has not slept and she has been restless. She does endorse mild shortness of breath, intermittent for an unknown amount of time as well as mild confusion. She is unsure what happened after she was discharged from the emergency department. Per staff she was noted to be unsteady in the waiting room and fell forward, out of a chair. Staff denies any loss of consciousness. Patient does not recall the event. She denies any current pain but feels very restless. She is compliant with her diabetes medications and hypertension occasions. She denies any headache, chest pain, fever, dysuria, nausea, vomiting, diarrhea. She lives alone. She does not smoke, drink alcohol, or use drugs.  Past Medical History:  Diagnosis Date  . Anxiety   . Asthma   . Depression   . GERD (gastroesophageal reflux disease)   . Hypertension   . Obesity   . Tobacco abuse   . Type II or unspecified type diabetes mellitus without mention of complication, not stated as uncontrolled     There are no active problems to display for this patient.   Past Surgical History:  Procedure Laterality Date  . TONSILLECTOMY    . TUBAL LIGATION       OB History    Gravida  1   Para  1   Term  1   Preterm      AB      Living        SAB      TAB      Ectopic      Multiple      Live Births               Home Medications    Prior to Admission medications   Medication Sig Start Date End Date Taking? Authorizing Provider  amLODipine (NORVASC) 5 MG tablet Take 5 mg by mouth daily.    [provider]  aspirin 325 MG EC tablet Take 325 mg by mouth at bedtime.     [provider]  atenolol (TENORMIN) 50 MG tablet Take 50 mg by mouth at bedtime.     [provider]  atorvastatin (LIPITOR) 20 MG tablet Take 20 mg by mouth at bedtime.  12/05/12   [provider]  furosemide (LASIX) 80 MG tablet Take 40 mg by mouth daily as needed.    [provider]  glyBURIDE-metformin (GLUCOVANCE) 5-500 MG per tablet Take 2 tablets by mouth 2 (two) times daily with a meal.    [provider]  lisinopril-hydrochlorothiazide (PRINZIDE,ZESTORETIC) 20-12.5 MG per tablet Take 1 tablet by mouth 2 (two) times daily.     [provider]  pantoprazole (  PROTONIX) 40 MG tablet Take 40 mg by mouth at bedtime.     [provider]  pramipexole (MIRAPEX) 0.5 MG tablet Take 1 tablet (0.5 mg total) by mouth at bedtime. 01/23/18   Quintella Reichert, MD  sertraline (ZOLOFT) 100 MG tablet Take 1 tablet (100 mg total) by mouth at bedtime. 01/23/18   Veryl Speak, MD    Family History Family History  Problem Relation Age of Onset  . Cancer Mother        lung  . Heart attack Father   . Heart disease Father   . Heart disease Sister   . Muscular dystrophy Cousin   . Muscular dystrophy Cousin   . Muscular dystrophy Cousin     Social History Social History   Tobacco Use  . Smoking status: Former Smoker    Packs/day: 0.50    Years: 40.00    Pack years: 20.00    Types: Cigarettes    Last attempt to quit: 10/30/2015    Years since quitting: 2.2  . Smokeless tobacco: Never Used  Substance Use Topics  . Alcohol use: No  . Drug use: No      Allergies   Sulfa antibiotics and Codeine   Review of Systems Review of Systems  All other systems reviewed and are negative.    Physical Exam Updated Vital Signs BP (!) 119/51   Pulse 78   Temp 98.7 F (37.1 C) (Oral)   Resp 18   Ht 5\' 5"  (1.651 m)   Wt 83.9 kg (185 lb)   SpO2 97%   BMI 30.79 kg/m   Physical Exam  Constitutional: She is oriented to person, place, and time. She appears well-developed and well-nourished.  HENT:  Head: Normocephalic.  Abrasion to left cheek without any tenderness.  Eyes: Pupils are equal, round, and reactive to light. EOM are normal.  Neck: Neck supple.  Cardiovascular: Normal rate and regular rhythm.  No murmur heard. Pulmonary/Chest: Effort normal and breath sounds normal. No respiratory distress.  Abdominal: Soft. There is no tenderness. There is no rebound and no guarding.  Musculoskeletal: She exhibits no edema or tenderness.  Neurological: She is alert and oriented to person, place, and time.  Oriented times three but mildly confused. Five out of five strength in all four extremities. Restless, fidgeting and writhing on the stretcher.  Skin: Skin is warm and dry.  Psychiatric:  Normal mood.    Nursing note and vitals reviewed.    ED Treatments / Results  Labs (all labs ordered are listed, but only abnormal results are displayed) Labs Reviewed  URINALYSIS, ROUTINE W REFLEX MICROSCOPIC - Abnormal; Notable for the following components:      Result Value   Glucose, UA 50 (*)    Protein, ur 100 (*)    Leukocytes, UA SMALL (*)    Bacteria, UA RARE (*)    All other components within normal limits  CK - Abnormal; Notable for the following components:   Total CK 686 (*)    All other components within normal limits  I-STAT VENOUS BLOOD GAS, ED - Abnormal; Notable for the following components:   pCO2, Ven 29.1 (*)    pO2, Ven 70.0 (*)    Bicarbonate 13.6 (*)    TCO2 14 (*)    Acid-base deficit 12.0 (*)    All other  components within normal limits  CBG MONITORING, ED - Abnormal; Notable for the following components:   Glucose-Capillary 239 (*)    All other components  within normal limits    EKG EKG Interpretation  Date/Time:  Thursday January 23 2018 07:56:36 EDT Ventricular Rate:  98 PR Interval:    QRS Duration: 86 QT Interval:  347 QTC Calculation: 443 R Axis:   75 Text Interpretation:  Sinus tachycardia Atrial premature complexes Confirmed by Quintella Reichert [phone]) on 01/23/2018 8:05:51 AM Also confirmed by Quintella Reichert [id number]), editor Laurena Spies [phone])  on 01/23/2018 9:40:31 AM   Radiology Ct Head Wo Contrast  Result Date: 01/23/2018 CLINICAL DATA:  Altered mental status EXAM: CT HEAD WITHOUT CONTRAST TECHNIQUE: Contiguous axial images were obtained from the base of the skull through the vertex without intravenous contrast. COMPARISON:  None. FINDINGS: Brain: No evidence of acute infarction, hemorrhage, hydrocephalus, extra-axial collection or mass lesion/mass effect. Old lacunar infarcts are noted within the basal ganglia on the right. Mild atrophic changes are noted. Vascular: No hyperdense vessel or unexpected calcification. Skull: Normal. Negative for fracture or focal lesion. Sinuses/Orbits: Chronic mucosal thickening is noted within the sphenoid sinus on the right. Orbits are within normal limits. Other: None. IMPRESSION: Chronic changes without acute abnormality. Electronically Signed   By: Inez Catalina M.D.   On: 01/23/2018 10:47   Dg Chest Port 1 View  Result Date: 01/23/2018 CLINICAL DATA:  Recent syncopal episode EXAM: PORTABLE CHEST 1 VIEW COMPARISON:  04/21/2014 FINDINGS: Cardiac shadows within normal limits. The lungs are well aerated bilaterally. Mild vascular prominence is noted centrally without interstitial edema. No focal infiltrate is noted. No bony abnormality is seen. IMPRESSION: Mild vascular congestion without interstitial edema. Electronically Signed   By: Inez Catalina  M.D.   On: 01/23/2018 08:25    Procedures Procedures (including critical care time)  Medications Ordered in ED Medications  pramipexole (MIRAPEX) tablet 1 mg (1 mg Oral Given 01/23/18 0831)     Initial Impression / Assessment and Plan / ED Course  I have reviewed the triage vital signs and the nursing notes.  Pertinent labs & imaging results that were available during my care of the patient were reviewed by me and considered in my medical decision making (see chart for details).     Patient here for evaluation following a fall, with severe restless leg. Patient agitated and writhing on initial evaluation. She was treated with Mirapex and on repeat assessment she is calm and appropriate, feeling significantly improved. Concern for possible withdrawal secondary to running out of her Mirapex. In terms of her fall, she does have a contusion to her face, no evidence of serious intracranial injury. Discussed with patient importance of continue current medications as directed. Discussed outpatient neurology and PCP follow-up as well as return precautions.  Presentation is not consistent with PE, sepsis, CHF, rhabdomyolysis.  Records reviewed in Epic from prior ED visit.   Final Clinical Impressions(s) / ED Diagnoses   Final diagnoses:  Fall, initial encounter  Contusion of face, initial encounter  Adverse effect of drug, initial encounter    ED Discharge Orders        Ordered    pramipexole (MIRAPEX) 0.5 MG tablet  Daily at bedtime     01/23/18 1105    Ambulatory referral to Neurology    Comments:  An appointment is requested in approximately: 2 weeks   01/23/18 1105       Quintella Reichert, MD 01/23/18 1400

## 2018-01-23 NOTE — Discharge Instructions (Addendum)
You had a reaction from stopping your Mirapex. It is important to take this medication as directed and not discontinue it without a taper. Get rechecked immediately if you have any new or concerning symptoms.

## 2018-01-23 NOTE — ED Notes (Signed)
Sitter;CW ambulated pt. To restroom and said that pt. Was slightly unsteady.

## 2018-01-23 NOTE — ED Notes (Signed)
Pt. Is unable to sit still long enough for ortho vitals.

## 2018-01-23 NOTE — ED Triage Notes (Signed)
Pt reports that she has restless leg syndrome which has kept her from sleeping for 3 days.  Pt also reports she was SOB however it resolved prior to EMS arriving.  Hx of anxiety.

## 2018-01-29 ENCOUNTER — Encounter: Payer: Self-pay | Admitting: Physician Assistant

## 2018-01-29 ENCOUNTER — Ambulatory Visit (INDEPENDENT_AMBULATORY_CARE_PROVIDER_SITE_OTHER): Payer: Medicare Other | Admitting: Physician Assistant

## 2018-01-29 VITALS — BP 146/74 | HR 98 | Temp 98.7°F | Resp 16 | Ht 64.0 in | Wt 198.2 lb

## 2018-01-29 DIAGNOSIS — G2581 Restless legs syndrome: Secondary | ICD-10-CM

## 2018-01-29 DIAGNOSIS — N184 Chronic kidney disease, stage 4 (severe): Secondary | ICD-10-CM | POA: Diagnosis not present

## 2018-01-29 DIAGNOSIS — F411 Generalized anxiety disorder: Secondary | ICD-10-CM | POA: Diagnosis not present

## 2018-01-29 DIAGNOSIS — I129 Hypertensive chronic kidney disease with stage 1 through stage 4 chronic kidney disease, or unspecified chronic kidney disease: Secondary | ICD-10-CM

## 2018-01-29 MED ORDER — SERTRALINE HCL 100 MG PO TABS: 100.0000 mg | ORAL_TABLET | Freq: Every day | ORAL | 0 refills | Status: DC

## 2018-01-29 MED ORDER — PRAMIPEXOLE DIHYDROCHLORIDE 0.5 MG PO TABS: 0.5000 mg | ORAL_TABLET | Freq: Every day | ORAL | 0 refills | Status: DC

## 2018-01-29 NOTE — Progress Notes (Signed)
Amy Herrera  MRN: [id number] DOB: Nov 26, 1950  PCP: Burnard Bunting, MD  Subjective:  Pt is a 67 year old female who presents to clinic for establish care.   She ran out of Mirapex and never heard back from her PCP regarding refills. Went through withdrawals and went to the emergency department earlier this week. She has a few days left of medication "I tried to get refills, no one got back with me. And no one ever told me about what would happen if I ran out"  Retired at the end of last year and did not have insurance until last week.   PCP was Dr. Kristopher Glee with [facility]. She has been unhappy with her medical care at that office and would like to transfer her care here.   CKD stage 4 - she has appt with nephrologist Dr. Joelyn Oms on July 29.   Anxiety - zoloft 100mg  x several years. She is also out of this medication. Denies SI or HI.   HTN - Prinzide 20-12.6mg  qd and Norvasc 5mg  qd. Blood pressure today 146/74. She checks home blood pressures usually within normal limits.    Last colonoscopy - due next year.  MM - utd Dexa - did this a few years ago. Was not asked to return for another screening.  PAP - never had abnormal PAP   Review of Systems  Cardiovascular: Negative for chest pain and palpitations.  Gastrointestinal: Negative for abdominal pain, nausea and vomiting.  Psychiatric/Behavioral: Negative for dysphoric mood, self-injury and suicidal ideas. The patient is nervous/anxious.     There are no active problems to display for this patient.   Current Outpatient Medications on File Prior to Visit  Medication Sig Dispense Refill  . amLODipine (NORVASC) 5 MG tablet Take 5 mg by mouth daily.    Marland Kitchen aspirin 81 MG tablet Take 81 mg by mouth daily.    Marland Kitchen atorvastatin (LIPITOR) 20 MG tablet Take 20 mg by mouth at bedtime.     . calcitRIOL (ROCALTROL) 0.25 MCG capsule Take 0.25 mcg by mouth daily.    . furosemide (LASIX) 80 MG tablet Take 40 mg by mouth daily as  needed.    . glyBURIDE-metformin (GLUCOVANCE) 5-500 MG per tablet Take 2 tablets by mouth 2 (two) times daily with a meal.    . lisinopril-hydrochlorothiazide (PRINZIDE,ZESTORETIC) 20-12.5 MG per tablet Take 1 tablet by mouth 2 (two) times daily.     . pramipexole (MIRAPEX) 0.5 MG tablet Take 1 tablet (0.5 mg total) by mouth at bedtime. 7 tablet 0  . sertraline (ZOLOFT) 100 MG tablet Take 1 tablet (100 mg total) by mouth at bedtime. 30 tablet 0  . aspirin 325 MG EC tablet Take 325 mg by mouth at bedtime.     Marland Kitchen atenolol (TENORMIN) 50 MG tablet Take 50 mg by mouth at bedtime.     . pantoprazole (PROTONIX) 40 MG tablet Take 40 mg by mouth at bedtime.      No current facility-administered medications on file prior to visit.     Allergies  Allergen Reactions  . Sulfa Antibiotics Swelling    Face/mouth  . Codeine Hives     Objective:  BP (!) 146/74 (BP Location: Left Arm, Patient Position: Sitting, Cuff Size: Normal)   Pulse 98   Temp 98.7 F (37.1 C) (Oral)   Resp 16   Ht 5\' 4"  (1.626 m)   Wt 198 lb 3.2 oz (89.9 kg)   SpO2 99%   BMI  34.02 kg/m   Physical Exam  Constitutional: She is oriented to person, place, and time. No distress.  Cardiovascular: Normal rate, regular rhythm and normal heart sounds.  Neurological: She is alert and oriented to person, place, and time.  Skin: Skin is warm and dry.  Psychiatric: Judgment normal.  Vitals reviewed.   Assessment and Plan :  1. Restless leg syndrome - pt presented to ED earlier this week with withdrawal from Mirapex as she was not able to get refill from PCP's office. Plan to refill 90 days. She will transfer medical records to our office. Pt will rtc in 2-3 months for annual exam. Plan for routine lab work at that time. Spent 20 mins with the patient - greater than 50% of the visit was face to face counseling patient regarding medication side effects, patient medical history and anticipatory guidance. - pramipexole (MIRAPEX) 0.5 MG  tablet; Take 1 tablet (0.5 mg total) by mouth at bedtime.  Dispense: 90 tablet; Refill: 0  2. Generalized anxiety disorder - sertraline (ZOLOFT) 100 MG tablet; Take 1 tablet (100 mg total) by mouth at bedtime.  Dispense: 90 tablet; Refill: 0    Mercer Pod, PA-C  [facility] at [facility] 01/29/2018 2:14 PM

## 2018-01-29 NOTE — Patient Instructions (Addendum)
Please come back and see me in 2-3 months for your annual exam.  Have your records faxed to our clinic.   Thank you for coming in today. I hope you feel we met your needs.  Feel free to call PCP if you have any questions or further requests.  Please consider signing up for MyChart if you do not already have it, as this is a great way to communicate with me.  Best,  [employer], PA-C

## 2018-02-12 DIAGNOSIS — N189 Chronic kidney disease, unspecified: Secondary | ICD-10-CM | POA: Diagnosis not present

## 2018-02-12 DIAGNOSIS — N184 Chronic kidney disease, stage 4 (severe): Secondary | ICD-10-CM | POA: Diagnosis not present

## 2018-02-17 DIAGNOSIS — D631 Anemia in chronic kidney disease: Secondary | ICD-10-CM | POA: Diagnosis not present

## 2018-02-17 DIAGNOSIS — E872 Acidosis: Secondary | ICD-10-CM | POA: Diagnosis not present

## 2018-02-17 DIAGNOSIS — E875 Hyperkalemia: Secondary | ICD-10-CM | POA: Diagnosis not present

## 2018-02-17 DIAGNOSIS — I129 Hypertensive chronic kidney disease with stage 1 through stage 4 chronic kidney disease, or unspecified chronic kidney disease: Secondary | ICD-10-CM | POA: Diagnosis not present

## 2018-02-17 DIAGNOSIS — N2581 Secondary hyperparathyroidism of renal origin: Secondary | ICD-10-CM | POA: Diagnosis not present

## 2018-02-17 DIAGNOSIS — N184 Chronic kidney disease, stage 4 (severe): Secondary | ICD-10-CM | POA: Diagnosis not present

## 2018-02-17 DIAGNOSIS — R809 Proteinuria, unspecified: Secondary | ICD-10-CM | POA: Diagnosis not present

## 2018-02-25 ENCOUNTER — Encounter (HOSPITAL_COMMUNITY): Payer: Self-pay

## 2018-03-03 ENCOUNTER — Other Ambulatory Visit (HOSPITAL_COMMUNITY): Payer: Self-pay | Admitting: *Deleted

## 2018-03-04 ENCOUNTER — Ambulatory Visit (HOSPITAL_COMMUNITY)
Admission: RE | Admit: 2018-03-04 | Discharge: 2018-03-04 | Disposition: A | Discharge status: Home / Self Care | Payer: Self-pay | Source: Ambulatory Visit | Attending: Nephrology | Admitting: Nephrology

## 2018-03-04 DIAGNOSIS — N189 Chronic kidney disease, unspecified: Secondary | ICD-10-CM | POA: Insufficient documentation

## 2018-03-04 DIAGNOSIS — D531 Other megaloblastic anemias, not elsewhere classified: Secondary | ICD-10-CM | POA: Insufficient documentation

## 2018-03-04 MED ORDER — SODIUM CHLORIDE 0.9 % IV SOLN
510.0000 mg | INTRAVENOUS | Status: DC
  Administered 2018-03-04: 510 mg via INTRAVENOUS
  Filled 2018-03-04: qty 17

## 2018-03-04 NOTE — Discharge Instructions (Signed)

## 2018-03-11 ENCOUNTER — Encounter (HOSPITAL_COMMUNITY)
Admission: RE | Admit: 2018-03-11 | Discharge: 2018-03-11 | Disposition: A | Discharge status: Home / Self Care | Payer: Self-pay | Source: Ambulatory Visit | Attending: Nephrology | Admitting: Nephrology

## 2018-03-11 DIAGNOSIS — D631 Anemia in chronic kidney disease: Secondary | ICD-10-CM | POA: Insufficient documentation

## 2018-03-11 DIAGNOSIS — N189 Chronic kidney disease, unspecified: Secondary | ICD-10-CM | POA: Insufficient documentation

## 2018-03-11 MED ORDER — SODIUM CHLORIDE 0.9 % IV SOLN
510.0000 mg | INTRAVENOUS | Status: DC
  Administered 2018-03-11: 510 mg via INTRAVENOUS
  Filled 2018-03-11: qty 17

## 2018-03-19 DIAGNOSIS — E875 Hyperkalemia: Secondary | ICD-10-CM | POA: Diagnosis not present

## 2018-03-28 ENCOUNTER — Telehealth: Payer: Self-pay | Admitting: Physician Assistant

## 2018-03-28 NOTE — Telephone Encounter (Signed)
Left a VM in regards to her appt she has with McVey on 04/18/2018. The provider will be out of the office on that day and needs to be rescheduled.

## 2018-04-18 ENCOUNTER — Ambulatory Visit: Payer: Medicare Other | Admitting: Physician Assistant

## 2018-04-24 ENCOUNTER — Other Ambulatory Visit: Payer: Self-pay | Admitting: Physician Assistant

## 2018-04-24 DIAGNOSIS — G2581 Restless legs syndrome: Secondary | ICD-10-CM

## 2018-04-24 DIAGNOSIS — F411 Generalized anxiety disorder: Secondary | ICD-10-CM

## 2018-04-24 NOTE — Telephone Encounter (Signed)
Requested Prescriptions  Pending Prescriptions Disp Refills  . pramipexole (MIRAPEX) 0.5 MG tablet [Pharmacy Med Name: PRAMIPEXOLE 0.5 MG TABLET] 90 tablet 1    Sig: TAKE 1 TABLET BY MOUTH ONCE DAILY AT BEDTIME     Neurology:  Parkinsonian Agents Passed - 04/24/2018  6:20 AM      Passed - Last BP in normal range    BP Readings from Last 1 Encounters:  03/11/18 129/63         Passed - Valid encounter within last 12 months    Recent Outpatient Visits          2 months ago Restless leg syndrome   [facility] at [facility] Gelene Mink, PA-C      Future Appointments            Tomorrow McVey, Gelene Mink, PA-C Primary Care at [place], [facility]         . sertraline (ZOLOFT) 100 MG tablet [Pharmacy Med Name: SERTRALINE HCL 100 MG TABLET] 90 tablet 0    Sig: TAKE 1 TABLET BY MOUTH EVERYDAY AT BEDTIME     Psychiatry:  Antidepressants - SSRI Passed - 04/24/2018  6:20 AM      Passed - Valid encounter within last 6 months    Recent Outpatient Visits          2 months ago Restless leg syndrome   Primary Care at [facility], Gelene Mink, PA-C      Future Appointments            Tomorrow McVey, Gelene Mink, PA-C Primary Care at [place], [facility]          Requested Prescriptions  Pending Prescriptions Disp Refills  . pramipexole (MIRAPEX) 0.5 MG tablet [Pharmacy Med Name: PRAMIPEXOLE 0.5 MG TABLET] 90 tablet 1    Sig: TAKE 1 TABLET BY MOUTH ONCE DAILY AT BEDTIME     Neurology:  Parkinsonian Agents Passed - 04/24/2018  6:20 AM      Passed - Last BP in normal range    BP Readings from Last 1 Encounters:  03/11/18 129/63         Passed - Valid encounter within last 12 months    Recent Outpatient Visits          2 months ago Restless leg syndrome   Primary Care at [facility], Gelene Mink, PA-C      Future Appointments            Tomorrow McVey, Gelene Mink, PA-C Primary Care at [place], [facility]         . sertraline (ZOLOFT) 100 MG tablet  [Pharmacy Med Name: SERTRALINE HCL 100 MG TABLET] 90 tablet 1    Sig: TAKE 1 TABLET BY MOUTH EVERYDAY AT BEDTIME     Psychiatry:  Antidepressants - SSRI Passed - 04/24/2018  6:20 AM      Passed - Valid encounter within last 6 months    Recent Outpatient Visits          2 months ago Restless leg syndrome   Primary Care at [facility], Gelene Mink, PA-C      Future Appointments            Tomorrow McVey, Gelene Mink, PA-C Primary Care at [place], [facility]

## 2018-04-25 ENCOUNTER — Ambulatory Visit (INDEPENDENT_AMBULATORY_CARE_PROVIDER_SITE_OTHER): Payer: Medicare Other | Admitting: Physician Assistant

## 2018-04-25 ENCOUNTER — Other Ambulatory Visit: Payer: Self-pay

## 2018-04-25 ENCOUNTER — Encounter: Payer: Self-pay | Admitting: Physician Assistant

## 2018-04-25 VITALS — BP 138/86 | HR 89 | Temp 98.8°F | Resp 16 | Ht 64.0 in | Wt 199.2 lb

## 2018-04-25 DIAGNOSIS — E785 Hyperlipidemia, unspecified: Secondary | ICD-10-CM | POA: Diagnosis not present

## 2018-04-25 DIAGNOSIS — R030 Elevated blood-pressure reading, without diagnosis of hypertension: Secondary | ICD-10-CM

## 2018-04-25 DIAGNOSIS — N184 Chronic kidney disease, stage 4 (severe): Secondary | ICD-10-CM | POA: Diagnosis not present

## 2018-04-25 DIAGNOSIS — T753XXA Motion sickness, initial encounter: Secondary | ICD-10-CM

## 2018-04-25 DIAGNOSIS — E1122 Type 2 diabetes mellitus with diabetic chronic kidney disease: Secondary | ICD-10-CM | POA: Diagnosis not present

## 2018-04-25 MED ORDER — SCOPOLAMINE 1 MG/3DAYS TD PT72: 1.0000 | MEDICATED_PATCH | TRANSDERMAL | 0 refills | Status: AC

## 2018-04-25 MED ORDER — ATORVASTATIN CALCIUM 20 MG PO TABS: 20.0000 mg | ORAL_TABLET | Freq: Every day | ORAL | 3 refills | Status: DC

## 2018-04-25 MED ORDER — REPAGLINIDE 0.5 MG PO TABS: 0.5000 mg | ORAL_TABLET | Freq: Three times a day (TID) | ORAL | 3 refills | Status: DC

## 2018-04-25 NOTE — Patient Instructions (Addendum)
Stop taking Metformin Start taking Repaglinide 0.5 mg before each meal (3 times/day    Come back and see me in 3 months to recheck A1C.   Repaglinide tablets What is this medicine? REPAGLINIDE (re PAG lin ide) helps to treat type 2 diabetes. It helps to control blood sugar. Treatment is combined with diet and exercise. This medicine may be used for other purposes; ask your health care provider or pharmacist if you have questions. COMMON BRAND NAME(S): Prandin What should I tell my health care provider before I take this medicine? They need to know if you have any of these conditions: -diabetic ketoacidosis -kidney disease -liver disease -severe infection or injury -an unusual or allergic reaction to repaglinide or other medicines, foods, dyes, or preservatives -pregnant or trying to get pregnant -breast-feeding How should I use this medicine? Take this medicine by mouth. Swallow it with a drink of water. Follow the directions on the prescription label. Take this medicine before meals. It should be taken no earlier than 30 minutes before meals. If a meal is skipped, skip the dose for that meal. Do not take more often than directed. Talk to your pediatrician regarding the use of this medicine in children. Special care may be needed. Elderly patients over 88 years old may have a stronger reaction and need a smaller dose. Overdosage: If you think you have taken too much of this medicine contact a poison control center or emergency room at once. NOTE: This medicine is only for you. Do not share this medicine with others. What if I miss a dose? If you miss a dose before a meal, skip that dose. If it is almost time for your next dose, take only that dose with the next scheduled meal as directed. Do not take double or extra doses. What may interact with this medicine? -barbiturates like phenobarbital or primidone -carbamazepine -clarithromycin -erythromycin -gemfibrozil -isophane insulin,  NPH -medicines for fungal or yeast infections such as itraconazole, ketoconazole, miconazole -montelukast -other medicines for diabetes -rifampin -simvastatin Many medications may cause an increase or decrease in blood sugar, these include: -alcohol containing beverages -aspirin and aspirin-like drugs -chloramphenicol -chromium -diuretics -female hormones, such as estrogens or progestins, birth control pills -heart medicines -isoniazid -female hormones or anabolic steroids -medications for weight loss -medicines for allergies, asthma, cold, or cough -medicines for mental problems -medicines called MAO inhibitors - Nardil, Parnate, Marplan, Eldepryl -niacin -NSAIDS, such as ibuprofen -pentamidine -phenytoin -probenecid -quinolone antibiotics such as ciprofloxacin, levofloxacin, ofloxacin -some herbal dietary supplements -steroid medicines such as prednisone or cortisone -thyroid hormones This list may not describe all possible interactions. Give your health care provider a list of all the medicines, herbs, non-prescription drugs, or dietary supplements you use. Also tell them if you smoke, drink alcohol, or use illegal drugs. Some items may interact with your medicine. What should I watch for while using this medicine? Visit your doctor or health care professional for regular checks on your progress. A test called the HbA1C (A1C) will be monitored. This is a simple blood test. It measures your blood sugar control over the last 2 to 3 months. You will receive this test every 3 to 6 months. Learn how to check your blood sugar. Learn the symptoms of low and high blood sugar and how to manage them. Always carry a quick-source of sugar with you in case you have symptoms of low blood sugar. Examples include hard sugar candy or glucose tablets. Make sure others know that you can choke if  you eat or drink when you develop serious symptoms of low blood sugar, such as seizures or  unconsciousness. They must get medical help at once. Tell your doctor or health care professional if you have high blood sugar. You might need to change the dose of your medicine. If you are sick or exercising more than usual, you might need to change the dose of your medicine. Do not skip meals. Ask your doctor or health care professional if you should avoid alcohol. Many nonprescription cough and cold products contain sugar or alcohol. These can affect blood sugar. Wear a medical ID bracelet or chain, and carry a card that describes your disease and details of your medicine and dosage times. What side effects may I notice from receiving this medicine? Side effects that you should report to your doctor or health care professional as soon as possible: -allergic reactions like skin rash, itching or hives, swelling of the face, lips, or tongue -breathing difficulties -dark urine -fever, chills -signs and symptoms of low blood sugar such as feeling anxious, confusion, dizziness, increased hunger, unusually weak or tired, sweating, shakiness, cold, irritable, headache, blurred vision, fast heartbeat, loss of consciousness -vomiting -yellowing of the eyes or skin Side effects that usually do not require medical attention (report to your doctor or health care professional if they continue or are bothersome): -back pain -diarrhea -headache -joint pain -nausea This list may not describe all possible side effects. Call your doctor for medical advice about side effects. You may report side effects to FDA at 1-800-FDA-1088. Where should I keep my medicine? Keep out of the reach of children. Store at room temperature between 15 and 30 degrees C (59 and 86 degrees F). Keep container tightly closed. Throw away any unused medicine after the expiration date. NOTE: This sheet is a summary. It may not cover all possible information. If you have questions about this medicine, talk to your doctor, pharmacist, or  health care provider.  2018 Elsevier/Gold Standard (2016-08-16 13:46:47)

## 2018-04-25 NOTE — Progress Notes (Signed)
Amy Herrera  MRN: [id number] DOB: Oct 28, 1950  PCP: Burnard Bunting, MD  Subjective:  Pt is a 67 year old female who presents to clinic for several complaints.   Restless Leg syndrome - Mirapex. "sometimes it's okay. Then it can be awful".   CKD stage 4 - Sees nephrologist q 3 months. she has appt with nephrologist Dr. Joelyn Oms on Oct 22. Will check labs at that visit. She is on kidney transplant list at [facility]  H/o iron def anemia - monitored by Joelyn Oms.   Diabetes - A1C checked three months ago by Dr. Joelyn Oms - it was 7.1.   Medication refill Atorvastatin Calcium (Lipitor) 20 mg  Anxiety - zoloft 100mg  x several years. Denies SI or HI.   HTN - Prinzide 20-12.6mg  qd and Norvasc 5mg  qd. Blood pressure today 150/80. She checks home blood pressures usually within normal limits.    Would like scopolamine patch - headed on a cruise in 2 weeks.   No results found for: CHOL No results found for: HDL No results found for: LDLCALC No results found for: TRIG No results found for: CHOLHDL No results found for: LDLDIRECT  Declines PNA vacc today.  Last colonoscopy - due next year.  MM - utd Dexa - did this a few years ago. Was not asked to return for another screening.  PAP - never had abnormal PAP   Review of Systems  Constitutional: Negative for chills and fever.  Gastrointestinal: Negative for abdominal pain, diarrhea, nausea and vomiting.  Endocrine: Negative for polydipsia, polyphagia and polyuria.  Musculoskeletal: Positive for arthralgias and myalgias.  Neurological: Positive for dizziness. Negative for light-headedness and headaches.   There are no active problems to display for this patient.   Current Outpatient Medications on File Prior to Visit  Medication Sig Dispense Refill  . amLODipine (NORVASC) 5 MG tablet Take 5 mg by mouth daily.    Marland Kitchen aspirin 81 MG tablet Take 81 mg by mouth daily.    . calcitRIOL (ROCALTROL) 0.25 MCG capsule Take 0.25 mcg by mouth  daily.    . furosemide (LASIX) 80 MG tablet Take 40 mg by mouth daily as needed.    . glyBURIDE-metformin (GLUCOVANCE) 5-500 MG per tablet Take 2 tablets by mouth 2 (two) times daily with a meal.    . lisinopril-hydrochlorothiazide (PRINZIDE,ZESTORETIC) 20-12.5 MG per tablet Take 1 tablet by mouth 2 (two) times daily.     . pramipexole (MIRAPEX) 0.5 MG tablet TAKE 1 TABLET BY MOUTH ONCE DAILY AT BEDTIME 90 tablet 1  . sertraline (ZOLOFT) 100 MG tablet TAKE 1 TABLET BY MOUTH EVERYDAY AT BEDTIME 90 tablet 1  . atorvastatin (LIPITOR) 20 MG tablet Take 20 mg by mouth at bedtime.      No current facility-administered medications on file prior to visit.     Allergies  Allergen Reactions  . Sulfa Antibiotics Swelling    Face/mouth  . Codeine Hives     Objective:  BP (!) 150/80 (BP Location: Left Arm, Cuff Size: Normal)   Pulse 89   Temp 98.8 F (37.1 C) (Oral)   Resp 16   Ht 5\' 4"  (1.626 m)   Wt 199 lb 3.2 oz (90.4 kg)   SpO2 96%   BMI 34.19 kg/m   Physical Exam  Constitutional: She is oriented to person, place, and time. She appears well-developed. No distress.  Cardiovascular: Normal rate, regular rhythm and normal heart sounds.  Neurological: She is alert and oriented to person, place, and time.  Skin: Skin is warm and dry.  Psychiatric: Judgment normal.  Vitals reviewed.   Assessment and Plan :  1. Type 2 diabetes mellitus with stage 4 chronic kidney disease, without long-term current use of insulin (HCC) 2. Stage 4 chronic kidney disease ([place]) - pt presents for medications. Considering CKD, will stop Metformin and start Prandin - she has sulfa allergy so will not start sulfonylurea. She gets A1C checked routinely by nephrologist Dr. Joelyn Oms. She has appt with him in 2 weeks. RTC in 3 months for A1C check.  Declines PNA vacc today. - repaglinide (PRANDIN) 0.5 MG tablet; Take 1 tablet (0.5 mg total) by mouth 3 (three) times daily before meals.  Dispense: 90 tablet; Refill:  3  3. Hyperlipidemia, unspecified hyperlipidemia type - labs checked with nephrologist  - atorvastatin (LIPITOR) 20 MG tablet; Take 1 tablet (20 mg total) by mouth at bedtime.  Dispense: 90 tablet; Refill: 3  4. Motion sickness, initial encounter - preventative care for upcoming cruise.  - scopolamine (TRANSDERM-SCOP, 1.5 MG,) 1 MG/3DAYS; Place 1 patch (1.5 mg total) onto the skin every 3 (three) days for 7 days.  Dispense: 4 patch; Refill: 0  5. Elevated blood pressure reading - recheck is 138/86 - Recheck vitals  Mercer Pod, PA-C  [facility] [place] 04/25/2018 9:56 AM  Please note: Portions of this report may have been transcribed using dragon voice recognition software. Every effort was made to ensure accuracy; however, inadvertent computerized transcription errors may be present.

## 2018-04-28 ENCOUNTER — Telehealth: Payer: Self-pay | Admitting: Physician Assistant

## 2018-04-28 NOTE — Telephone Encounter (Signed)
Medication Scopolamine Patches was approved by her insurance company. Called the pharmacy and they said she has not picked up her prescription yet because they had just received it. They will notify the patient when it is ready for pick up.

## 2018-05-27 DIAGNOSIS — N2581 Secondary hyperparathyroidism of renal origin: Secondary | ICD-10-CM | POA: Diagnosis not present

## 2018-05-27 DIAGNOSIS — D631 Anemia in chronic kidney disease: Secondary | ICD-10-CM | POA: Diagnosis not present

## 2018-05-27 DIAGNOSIS — I129 Hypertensive chronic kidney disease with stage 1 through stage 4 chronic kidney disease, or unspecified chronic kidney disease: Secondary | ICD-10-CM | POA: Diagnosis not present

## 2018-05-27 DIAGNOSIS — E875 Hyperkalemia: Secondary | ICD-10-CM | POA: Diagnosis not present

## 2018-05-27 DIAGNOSIS — E872 Acidosis: Secondary | ICD-10-CM | POA: Diagnosis not present

## 2018-05-27 DIAGNOSIS — N189 Chronic kidney disease, unspecified: Secondary | ICD-10-CM | POA: Diagnosis not present

## 2018-05-27 DIAGNOSIS — R809 Proteinuria, unspecified: Secondary | ICD-10-CM | POA: Diagnosis not present

## 2018-05-27 DIAGNOSIS — N184 Chronic kidney disease, stage 4 (severe): Secondary | ICD-10-CM | POA: Diagnosis not present

## 2018-06-06 ENCOUNTER — Encounter: Payer: Self-pay | Admitting: Physician Assistant

## 2018-06-06 DIAGNOSIS — N184 Chronic kidney disease, stage 4 (severe): Secondary | ICD-10-CM | POA: Insufficient documentation

## 2018-06-10 DIAGNOSIS — I10 Essential (primary) hypertension: Secondary | ICD-10-CM | POA: Diagnosis not present

## 2018-07-09 ENCOUNTER — Other Ambulatory Visit: Payer: Self-pay | Admitting: Physician Assistant

## 2018-07-09 DIAGNOSIS — N184 Chronic kidney disease, stage 4 (severe): Secondary | ICD-10-CM

## 2018-07-09 DIAGNOSIS — E1122 Type 2 diabetes mellitus with diabetic chronic kidney disease: Secondary | ICD-10-CM

## 2018-07-09 NOTE — Telephone Encounter (Signed)
Requested Prescriptions  Pending Prescriptions Disp Refills  . repaglinide (PRANDIN) 0.5 MG tablet [Pharmacy Med Name: REPAGLINIDE 0.5 MG TABLET] 270 tablet 0    Sig: TAKE 1 TABLET BY MOUTH 3 TIMES DAILY BEFORE MEALS.     Endocrinology:  Diabetes - Meglitinides Failed - 07/09/2018  2:21 AM      Failed - HBA1C is between 0 and 7.9 and within 180 days    No results found for: HGBA1C       Passed - Valid encounter within last 6 months    Recent Outpatient Visits          2 months ago Type 2 diabetes mellitus with stage 4 chronic kidney disease, without long-term current use of insulin [facility])   [facility] [place], PA-C   5 months ago Restless leg syndrome   Primary Care at [facility], [place], [place]

## 2018-09-22 DIAGNOSIS — R809 Proteinuria, unspecified: Secondary | ICD-10-CM | POA: Diagnosis not present

## 2018-09-22 DIAGNOSIS — N184 Chronic kidney disease, stage 4 (severe): Secondary | ICD-10-CM | POA: Diagnosis not present

## 2018-09-22 DIAGNOSIS — E875 Hyperkalemia: Secondary | ICD-10-CM | POA: Diagnosis not present

## 2018-09-22 DIAGNOSIS — N2581 Secondary hyperparathyroidism of renal origin: Secondary | ICD-10-CM | POA: Diagnosis not present

## 2018-09-22 DIAGNOSIS — I129 Hypertensive chronic kidney disease with stage 1 through stage 4 chronic kidney disease, or unspecified chronic kidney disease: Secondary | ICD-10-CM | POA: Diagnosis not present

## 2018-09-22 DIAGNOSIS — D631 Anemia in chronic kidney disease: Secondary | ICD-10-CM | POA: Diagnosis not present

## 2018-09-22 DIAGNOSIS — E872 Acidosis: Secondary | ICD-10-CM | POA: Diagnosis not present

## 2018-10-09 ENCOUNTER — Other Ambulatory Visit: Payer: Self-pay | Admitting: Physician Assistant

## 2018-10-09 DIAGNOSIS — F411 Generalized anxiety disorder: Secondary | ICD-10-CM

## 2018-10-09 DIAGNOSIS — E1122 Type 2 diabetes mellitus with diabetic chronic kidney disease: Secondary | ICD-10-CM

## 2018-10-09 DIAGNOSIS — N184 Chronic kidney disease, stage 4 (severe): Secondary | ICD-10-CM

## 2018-10-09 NOTE — Telephone Encounter (Signed)
Attempted to call patient and schedule an appointment for her refills. She was suppose to come back to the office around Jan. 4 th to check her A1C but did not. Left message to call back and schedule an appointment. Last appointment was 04/25/2018 Juanda Crumble  PCP

## 2018-10-10 ENCOUNTER — Other Ambulatory Visit: Payer: Self-pay | Admitting: Physician Assistant

## 2018-10-10 DIAGNOSIS — G2581 Restless legs syndrome: Secondary | ICD-10-CM

## 2018-10-10 NOTE — Telephone Encounter (Signed)
Requested medication (s) are due for refill today:   Yes  Requested medication (s) are on the active medication list:   Yes  Future visit scheduled:   No   Last ordered: 04/23/18 by McVey   No new provider assigned.    Requested Prescriptions  Pending Prescriptions Disp Refills   pramipexole (MIRAPEX) 0.5 MG tablet [Pharmacy Med Name: PRAMIPEXOLE 0.5 MG TABLET] 90 tablet 1    Sig: TAKE 1 TABLET BY MOUTH ONCE DAILY AT BEDTIME     Neurology:  Parkinsonian Agents Passed - 10/10/2018  1:55 AM      Passed - Last BP in normal range    BP Readings from Last 1 Encounters:  04/25/18 138/86         Passed - Valid encounter within last 12 months    Recent Outpatient Visits          5 months ago Type 2 diabetes mellitus with stage 4 chronic kidney disease, without long-term current use of insulin [facility])   Primary Care at [facility] [place], PA-C   8 months ago Restless leg syndrome   Primary Care at [facility], [place], [place]

## 2018-12-08 DIAGNOSIS — F331 Major depressive disorder, recurrent, moderate: Secondary | ICD-10-CM | POA: Diagnosis not present

## 2018-12-08 DIAGNOSIS — F411 Generalized anxiety disorder: Secondary | ICD-10-CM | POA: Diagnosis not present

## 2018-12-24 DIAGNOSIS — F411 Generalized anxiety disorder: Secondary | ICD-10-CM | POA: Diagnosis not present

## 2018-12-24 DIAGNOSIS — F331 Major depressive disorder, recurrent, moderate: Secondary | ICD-10-CM | POA: Diagnosis not present

## 2018-12-27 ENCOUNTER — Other Ambulatory Visit: Payer: Self-pay | Admitting: Family Medicine

## 2018-12-27 DIAGNOSIS — E1122 Type 2 diabetes mellitus with diabetic chronic kidney disease: Secondary | ICD-10-CM

## 2018-12-27 DIAGNOSIS — G2581 Restless legs syndrome: Secondary | ICD-10-CM

## 2018-12-27 DIAGNOSIS — N184 Chronic kidney disease, stage 4 (severe): Secondary | ICD-10-CM

## 2018-12-27 NOTE — Telephone Encounter (Signed)
Requested Prescriptions  Pending Prescriptions Disp Refills  . pramipexole (MIRAPEX) 0.5 MG tablet [Pharmacy Med Name: PRAMIPEXOLE 0.5 MG TABLET] 90 tablet 0    Sig: TAKE 1 TABLET BY MOUTH EVERYDAY AT BEDTIME     Neurology:  Parkinsonian Agents Passed - 12/27/2018  6:21 PM      Passed - Last BP in normal range    BP Readings from Last 1 Encounters:  04/25/18 138/86         Passed - Valid encounter within last 12 months    Recent Outpatient Visits          8 months ago Type 2 diabetes mellitus with stage 4 chronic kidney disease, without long-term current use of insulin [facility])   Primary Care at [place], [place]   11 months ago Restless leg syndrome   Primary Care at [facility], [place], [place]

## 2018-12-27 NOTE — Telephone Encounter (Signed)
Requested Prescriptions  Pending Prescriptions Disp Refills  . repaglinide (PRANDIN) 0.5 MG tablet [Pharmacy Med Name: REPAGLINIDE 0.5 MG TABLET] 90 tablet 0    Sig: TAKE 1 TABLET BY MOUTH 3 TIMES DAILY BEFORE MEALS.     Endocrinology:  Diabetes - Meglitinides Failed - 12/27/2018  6:05 PM      Failed - HBA1C is between 0 and 7.9 and within 180 days    No results found for: HGBA1C       Failed - Valid encounter within last 6 months    Recent Outpatient Visits          8 months ago Type 2 diabetes mellitus with stage 4 chronic kidney disease, without long-term current use of insulin [facility])   Primary Care at [place], [place]   11 months ago Restless leg syndrome   Primary Care at [facility], [place], [place]

## 2019-01-07 DIAGNOSIS — F331 Major depressive disorder, recurrent, moderate: Secondary | ICD-10-CM | POA: Diagnosis not present

## 2019-01-07 DIAGNOSIS — F411 Generalized anxiety disorder: Secondary | ICD-10-CM | POA: Diagnosis not present

## 2019-01-21 ENCOUNTER — Other Ambulatory Visit: Payer: Self-pay | Admitting: Family Medicine

## 2019-01-21 DIAGNOSIS — N184 Chronic kidney disease, stage 4 (severe): Secondary | ICD-10-CM

## 2019-01-21 DIAGNOSIS — E1122 Type 2 diabetes mellitus with diabetic chronic kidney disease: Secondary | ICD-10-CM

## 2019-01-27 ENCOUNTER — Telehealth: Payer: Self-pay | Admitting: Registered Nurse

## 2019-01-27 NOTE — Telephone Encounter (Signed)
Pt would like a refill on her pramipexole (MIRAPEX) 0.5 MG tablet [[id number]] until her appointment on 02/06/19.  For toc .  Pharmacy:  [id number] - [place], [place] - [address] AT [place]. Please advise at [phone]

## 2019-02-02 NOTE — Telephone Encounter (Signed)
Patient had refill of 90 days in June should not be out of this medication

## 2019-02-05 DIAGNOSIS — N2581 Secondary hyperparathyroidism of renal origin: Secondary | ICD-10-CM | POA: Diagnosis not present

## 2019-02-05 DIAGNOSIS — E875 Hyperkalemia: Secondary | ICD-10-CM | POA: Diagnosis not present

## 2019-02-05 DIAGNOSIS — R809 Proteinuria, unspecified: Secondary | ICD-10-CM | POA: Diagnosis not present

## 2019-02-05 DIAGNOSIS — I129 Hypertensive chronic kidney disease with stage 1 through stage 4 chronic kidney disease, or unspecified chronic kidney disease: Secondary | ICD-10-CM | POA: Diagnosis not present

## 2019-02-05 DIAGNOSIS — D631 Anemia in chronic kidney disease: Secondary | ICD-10-CM | POA: Diagnosis not present

## 2019-02-05 DIAGNOSIS — E872 Acidosis: Secondary | ICD-10-CM | POA: Diagnosis not present

## 2019-02-05 DIAGNOSIS — N184 Chronic kidney disease, stage 4 (severe): Secondary | ICD-10-CM | POA: Diagnosis not present

## 2019-02-06 ENCOUNTER — Ambulatory Visit (INDEPENDENT_AMBULATORY_CARE_PROVIDER_SITE_OTHER): Payer: Medicare Other | Admitting: Registered Nurse

## 2019-02-06 ENCOUNTER — Encounter: Payer: Self-pay | Admitting: Registered Nurse

## 2019-02-06 ENCOUNTER — Other Ambulatory Visit: Payer: Self-pay | Admitting: Family Medicine

## 2019-02-06 ENCOUNTER — Other Ambulatory Visit: Payer: Self-pay

## 2019-02-06 VITALS — BP 160/70 | HR 102 | Temp 99.0°F | Resp 16 | Wt 194.0 lb

## 2019-02-06 DIAGNOSIS — F411 Generalized anxiety disorder: Secondary | ICD-10-CM | POA: Diagnosis not present

## 2019-02-06 DIAGNOSIS — E119 Type 2 diabetes mellitus without complications: Secondary | ICD-10-CM | POA: Diagnosis not present

## 2019-02-06 DIAGNOSIS — E1122 Type 2 diabetes mellitus with diabetic chronic kidney disease: Secondary | ICD-10-CM

## 2019-02-06 DIAGNOSIS — Z7689 Persons encountering health services in other specified circumstances: Secondary | ICD-10-CM

## 2019-02-06 DIAGNOSIS — N184 Chronic kidney disease, stage 4 (severe): Secondary | ICD-10-CM

## 2019-02-06 MED ORDER — HYDROXYZINE HCL 10 MG PO TABS: 10.0000 mg | ORAL_TABLET | Freq: Two times a day (BID) | ORAL | 0 refills | Status: DC

## 2019-02-06 NOTE — Progress Notes (Signed)
Established Patient Office Visit  Subjective:  Patient ID: Amy Herrera, female    DOB: [birth year]  Age: 68 y.o. MRN: [id number]  CC:  Chief Complaint  Patient presents with  . Establish Care    pt needs new pcp to manage medications and Chronic Conditions   . Medication Refill    HPI [name] presents for visit to establish care and anxiety  She states that anxiety has been a lifelong issue for her, but lately, she is having panic attacks regarding COVID and world events. She has been sleeping poorly. She is seeing a therapist regularly and exercises as tolerated. She is frustrated by her chronic medical conditions.  She is taking Zoloft 100mg  PO qpm.   Past Medical History:  Diagnosis Date  . Anxiety   . Asthma   . Depression   . GERD (gastroesophageal reflux disease)   . Hypertension   . Obesity   . Tobacco abuse   . Type II or unspecified type diabetes mellitus without mention of complication, not stated as uncontrolled     Past Surgical History:  Procedure Laterality Date  . TONSILLECTOMY    . TUBAL LIGATION      Family History  Problem Relation Age of Onset  . Cancer Mother        lung  . Heart attack Father   . Heart disease Father   . Heart disease Sister   . Hypertension Sister   . Muscular dystrophy Son   . Muscular dystrophy Cousin   . Muscular dystrophy Cousin   . Muscular dystrophy Cousin     Social History   Socioeconomic History  . Marital status: Widowed    Spouse name: Not on file  . Number of children: Not on file  . Years of education: Not on file  . Highest education level: Not on file  Occupational History  . Not on file  Social Needs  . Financial resource strain: Not hard at all  . Food insecurity    Worry: Never true    Inability: Never true  . Transportation needs    Medical: No    Non-medical: No  Tobacco Use  . Smoking status: Former Smoker    Packs/day: 0.50    Years: 40.00    Pack years: 20.00    Types:  Cigarettes    Quit date: 10/30/2015    Years since quitting: 3.2  . Smokeless tobacco: Never Used  Substance and Sexual Activity  . Alcohol use: No  . Drug use: No  . Sexual activity: Not Currently    Partners: Male  Lifestyle  . Physical activity    Days per week: 0 days    Minutes per session: 0 min  . Stress: Very much  Relationships  . Social Herbalist on phone: Three times a week    Gets together: Twice a week    Attends religious service: Never    Active member of club or organization: No    Attends meetings of clubs or organizations: Never    Relationship status: Widowed  . Intimate partner violence    Fear of current or ex partner: No    Emotionally abused: No    Physically abused: No    Forced sexual activity: No  Other Topics Concern  . Not on file  Social History Narrative  . Not on file    Outpatient Medications Prior to Visit  Medication Sig Dispense Refill  . aspirin 81  MG tablet Take 81 mg by mouth daily.    Marland Kitchen atorvastatin (LIPITOR) 20 MG tablet Take 1 tablet (20 mg total) by mouth at bedtime. 90 tablet 3  . furosemide (LASIX) 80 MG tablet Take 40 mg by mouth daily as needed.    . hydrALAZINE (APRESOLINE) 100 MG tablet     . lisinopril-hydrochlorothiazide (PRINZIDE,ZESTORETIC) 20-12.5 MG per tablet Take 1 tablet by mouth 2 (two) times daily.     . pramipexole (MIRAPEX) 0.5 MG tablet TAKE 1 TABLET BY MOUTH EVERYDAY AT BEDTIME 90 tablet 0  . repaglinide (PRANDIN) 0.5 MG tablet TAKE 1 TABLET BY MOUTH 3 TIMES DAILY BEFORE MEALS. 90 tablet 0  . sertraline (ZOLOFT) 100 MG tablet TAKE 1 TABLET BY MOUTH EVERYDAY AT BEDTIME 90 tablet 1  . sodium bicarbonate 650 MG tablet     . amLODipine (NORVASC) 5 MG tablet Take 5 mg by mouth daily.    . calcitRIOL (ROCALTROL) 0.25 MCG capsule Take 0.25 mcg by mouth daily.    Marland Kitchen glyBURIDE-metformin (GLUCOVANCE) 5-500 MG per tablet Take 2 tablets by mouth 2 (two) times daily with a meal.    . pramipexole (MIRAPEX) 0.5 MG  tablet Take by mouth.     No facility-administered medications prior to visit.     Allergies  Allergen Reactions  . Sulfa Antibiotics Swelling    Face/mouth  . Codeine Hives    ROS Review of Systems  Constitutional: Negative.   HENT: Negative.   Eyes: Negative.   Respiratory: Negative.   Cardiovascular: Negative.   Gastrointestinal: Negative.   Endocrine: Negative.   Genitourinary: Negative.   Musculoskeletal: Negative.   Allergic/Immunologic: Negative.   Neurological: Negative.   Hematological: Negative.   Psychiatric/Behavioral: The patient is nervous/anxious.   All other systems reviewed and are negative.     Objective:    Physical Exam  Constitutional: She is oriented to person, place, and time. She appears well-developed and well-nourished. No distress.  Cardiovascular: Normal rate and regular rhythm.  Pulmonary/Chest: Effort normal. No respiratory distress.  Neurological: She is alert and oriented to person, place, and time.  Skin: Skin is warm and dry. No rash noted. She is not diaphoretic. No erythema. No pallor.  Psychiatric: Her behavior is normal. Judgment and thought content normal. Her mood appears anxious.  Nursing note and vitals reviewed.   BP (!) 160/70   Pulse (!) 102   Temp 99 F (37.2 C) (Oral)   Resp 16   Wt 194 lb (88 kg)   SpO2 96%   BMI 33.30 kg/m  Wt Readings from Last 3 Encounters:  02/06/19 194 lb (88 kg)  04/25/18 199 lb 3.2 oz (90.4 kg)  03/11/18 185 lb (83.9 kg)     Health Maintenance Due  Topic Date Due  . Hepatitis C Screening  [birth year]  . MAMMOGRAM  07/14/2017  . COLONOSCOPY  11/13/2017  . DEXA SCAN  01/25/2018  . PNA vac Low Risk Adult (1 of 2 - PCV13) 01/25/2018  . TETANUS/TDAP  01/01/2019    There are no preventive care reminders to display for this patient.  No results found for: TSH Lab Results  Component Value Date   WBC 7.8 01/23/2018   HGB 8.8 (L) 01/23/2018   HCT 29.0 (L) 01/23/2018   MCV 87.6  01/23/2018   PLT 253 01/23/2018   Lab Results  Component Value Date   NA 137 01/23/2018   K 5.0 01/23/2018   CO2 16 (L) 01/23/2018   GLUCOSE 211 (H) 01/23/2018  BUN 80 (H) 01/23/2018   CREATININE 3.11 (H) 01/23/2018   BILITOT <0.2 (L) 04/21/2014   ALKPHOS 71 04/21/2014   AST 23 04/21/2014   ALT 28 04/21/2014   PROT 7.1 04/21/2014   ALBUMIN 3.2 (L) 04/21/2014   CALCIUM 8.6 (L) 01/23/2018   ANIONGAP 10 01/23/2018   No results found for: CHOL No results found for: HDL No results found for: LDLCALC No results found for: TRIG No results found for: CHOLHDL No results found for: HGBA1C    Assessment & Plan:   Problem List Items Addressed This Visit    None    Visit Diagnoses    Generalized anxiety disorder    -  Primary   Relevant Medications   hydrOXYzine (ATARAX/VISTARIL) 10 MG tablet      Meds ordered this encounter  Medications  . hydrOXYzine (ATARAX/VISTARIL) 10 MG tablet    Sig: Take 1 tablet (10 mg total) by mouth 2 (two) times a day.    Dispense:  60 tablet    Refill:  0    Order Specific Question:   Supervising Provider    Answer:   Forrest Moron [id number]    Follow-up: No follow-ups on file.   PLAN  Vistaril 10mg  PO bid PRN for anxiety. Discussed that this is a medication that potentially has anticholinergic side effects. She is being started on the lowest available dose at a reduced frequency for this reason. The alternative therapy would be benzodiazepines, which we are hesitant to use, particularly given this patient's age. Otherwise, many agents are ruled out by Stage 4 CKD.  She will return to clinic in around 2 weeks for labs and med check.   Patient encouraged to call clinic with any questions, comments, or concerns.    Maximiano Coss, NP

## 2019-02-06 NOTE — Patient Instructions (Signed)
° ° ° °  If you have lab work done today you will be contacted with your lab results within the next 2 weeks.  If you have not heard from us then please contact us. The fastest way to get your results is to register for My Chart. ° ° °IF you received an x-ray today, you will receive an invoice from [facility]. Please contact [facility] at [phone] with questions or concerns regarding your invoice.  ° °IF you received labwork today, you will receive an invoice from [facility]. Please contact [facility] at [phone] with questions or concerns regarding your invoice.  ° °Our billing staff will not be able to assist you with questions regarding bills from these companies. ° °You will be contacted with the lab results as soon as they are available. The fastest way to get your results is to activate your My Chart account. Instructions are located on the last page of this paperwork. If you have not heard from us regarding the results in 2 weeks, please contact this office. °  ° ° ° °

## 2019-02-06 NOTE — Telephone Encounter (Signed)
Forwarding medication refill to PCP for review. 

## 2019-02-27 ENCOUNTER — Encounter: Payer: Self-pay | Admitting: Registered Nurse

## 2019-02-27 ENCOUNTER — Other Ambulatory Visit: Payer: Self-pay

## 2019-02-27 ENCOUNTER — Ambulatory Visit (INDEPENDENT_AMBULATORY_CARE_PROVIDER_SITE_OTHER): Payer: Medicare Other | Admitting: Registered Nurse

## 2019-02-27 VITALS — BP 160/68 | HR 99 | Temp 98.0°F | Resp 16 | Ht 64.57 in | Wt 197.0 lb

## 2019-02-27 DIAGNOSIS — Z1329 Encounter for screening for other suspected endocrine disorder: Secondary | ICD-10-CM

## 2019-02-27 DIAGNOSIS — E119 Type 2 diabetes mellitus without complications: Secondary | ICD-10-CM

## 2019-02-27 DIAGNOSIS — Z13228 Encounter for screening for other metabolic disorders: Secondary | ICD-10-CM | POA: Diagnosis not present

## 2019-02-27 DIAGNOSIS — N184 Chronic kidney disease, stage 4 (severe): Secondary | ICD-10-CM | POA: Diagnosis not present

## 2019-02-27 DIAGNOSIS — Z130 Encounter for screening for diseases of the blood and blood-forming organs and certain disorders involving the immune mechanism: Secondary | ICD-10-CM

## 2019-02-27 DIAGNOSIS — F331 Major depressive disorder, recurrent, moderate: Secondary | ICD-10-CM | POA: Diagnosis not present

## 2019-02-27 DIAGNOSIS — F411 Generalized anxiety disorder: Secondary | ICD-10-CM | POA: Diagnosis not present

## 2019-02-27 DIAGNOSIS — Z13220 Encounter for screening for lipoid disorders: Secondary | ICD-10-CM | POA: Diagnosis not present

## 2019-02-27 NOTE — Patient Instructions (Signed)
° ° ° °  If you have lab work done today you will be contacted with your lab results within the next 2 weeks.  If you have not heard from us then please contact us. The fastest way to get your results is to register for My Chart. ° ° °IF you received an x-ray today, you will receive an invoice from [facility]. Please contact [facility] at [phone] with questions or concerns regarding your invoice.  ° °IF you received labwork today, you will receive an invoice from [facility]. Please contact [facility] at [phone] with questions or concerns regarding your invoice.  ° °Our billing staff will not be able to assist you with questions regarding bills from these companies. ° °You will be contacted with the lab results as soon as they are available. The fastest way to get your results is to activate your My Chart account. Instructions are located on the last page of this paperwork. If you have not heard from us regarding the results in 2 weeks, please contact this office. °  ° ° ° °

## 2019-02-27 NOTE — Progress Notes (Signed)
Established Patient Office Visit  Subjective:  Patient ID: Amy Herrera, female    DOB: [birth year]  Age: 68 y.o. MRN: [id number]  CC:  Chief Complaint  Patient presents with  . Chronic Conditions    3 week follow-up     HPI Amy Herrera presents for med check and labs.  She reports that she has taken the Vistaril around 3-4 times each week - and it has been very helpful with her anxiety. She also brought her past labs from her nephrologist - we will scan these to her chart and obtain new labs today for comparison  Regarding the potential anticholinergic side effects of Vistaril, she reports that she has not experienced dry mouth, trouble urinating, trouble moving bowels, or vision changes. We discussed continuing to monitor these symptoms. Again, vistaril was chosen, as given the status of her CKD, she is limited mostly to vistaril or benzodiazepines, and risk for benzodiazepines given age and other health history is higher.  Otherwise, she reports all is well. She states that she went grocery shopping this week - a big step for her in regards to anxiety. We will plan to see her in around 3 months for med check, labs, and follow up. This visit may be telehealth if the patient prefers - but labs will likely be warranted.   Past Medical History:  Diagnosis Date  . Anxiety   . Asthma   . Depression   . GERD (gastroesophageal reflux disease)   . Hypertension   . Obesity   . Tobacco abuse   . Type II or unspecified type diabetes mellitus without mention of complication, not stated as uncontrolled     Past Surgical History:  Procedure Laterality Date  . TONSILLECTOMY    . TUBAL LIGATION      Family History  Problem Relation Age of Onset  . Cancer Mother        lung  . Heart attack Father   . Heart disease Father   . Heart disease Sister   . Hypertension Sister   . Muscular dystrophy Son   . Muscular dystrophy Cousin   . Muscular dystrophy Cousin   . Muscular dystrophy  Cousin     Social History   Socioeconomic History  . Marital status: Widowed    Spouse name: Not on file  . Number of children: Not on file  . Years of education: Not on file  . Highest education level: Not on file  Occupational History  . Not on file  Social Needs  . Financial resource strain: Not hard at all  . Food insecurity    Worry: Never true    Inability: Never true  . Transportation needs    Medical: No    Non-medical: No  Tobacco Use  . Smoking status: Former Smoker    Packs/day: 0.50    Years: 40.00    Pack years: 20.00    Types: Cigarettes    Quit date: 10/30/2015    Years since quitting: 3.3  . Smokeless tobacco: Never Used  Substance and Sexual Activity  . Alcohol use: No  . Drug use: No  . Sexual activity: Not Currently    Partners: Male  Lifestyle  . Physical activity    Days per week: 0 days    Minutes per session: 0 min  . Stress: Very much  Relationships  . Social Herbalist on phone: Three times a week    Gets together: Twice a  week    Attends religious service: Never    Active member of club or organization: No    Attends meetings of clubs or organizations: Never    Relationship status: Widowed  . Intimate partner violence    Fear of current or ex partner: No    Emotionally abused: No    Physically abused: No    Forced sexual activity: No  Other Topics Concern  . Not on file  Social History Narrative  . Not on file    Outpatient Medications Prior to Visit  Medication Sig Dispense Refill  . aspirin 81 MG tablet Take 81 mg by mouth daily.    Marland Kitchen atorvastatin (LIPITOR) 20 MG tablet Take 1 tablet (20 mg total) by mouth at bedtime. 90 tablet 3  . furosemide (LASIX) 80 MG tablet Take 40 mg by mouth daily as needed.    . hydrALAZINE (APRESOLINE) 100 MG tablet     . hydrOXYzine (ATARAX/VISTARIL) 10 MG tablet Take 1 tablet (10 mg total) by mouth 2 (two) times a day. 60 tablet 0  . lisinopril-hydrochlorothiazide (PRINZIDE,ZESTORETIC)  20-12.5 MG per tablet Take 1 tablet by mouth 2 (two) times daily.     . pramipexole (MIRAPEX) 0.5 MG tablet TAKE 1 TABLET BY MOUTH EVERYDAY AT BEDTIME 90 tablet 0  . repaglinide (PRANDIN) 0.5 MG tablet TAKE 1 TABLET BY MOUTH 3 TIMES DAILY BEFORE MEALS 90 tablet 0  . sertraline (ZOLOFT) 100 MG tablet TAKE 1 TABLET BY MOUTH EVERYDAY AT BEDTIME 90 tablet 1  . sodium bicarbonate 650 MG tablet      No facility-administered medications prior to visit.     Allergies  Allergen Reactions  . Sulfa Antibiotics Swelling    Face/mouth  . Codeine Hives    ROS Review of Systems  Constitutional: Negative.   HENT: Negative.   Eyes: Negative.   Respiratory: Negative.   Cardiovascular: Negative.   Gastrointestinal: Negative.   Endocrine: Negative.   Genitourinary: Negative.   Musculoskeletal: Negative.   Skin: Negative.   Allergic/Immunologic: Negative.   Neurological: Negative.   Hematological: Negative.   Psychiatric/Behavioral: Negative for confusion, decreased concentration and sleep disturbance. The patient is nervous/anxious.   All other systems reviewed and are negative.     Objective:    Physical Exam  Constitutional: She is oriented to person, place, and time. She appears well-developed and well-nourished. No distress.  Cardiovascular: Normal rate and regular rhythm.  Pulmonary/Chest: Effort normal. No respiratory distress.  Neurological: She is alert and oriented to person, place, and time.  Skin: Skin is warm and dry. No rash noted. She is not diaphoretic. No erythema. No pallor.  Psychiatric: She has a normal mood and affect. Her behavior is normal. Judgment and thought content normal.    BP (!) 160/68   Pulse 99   Temp 98 F (36.7 C) (Oral)   Resp 16   Ht 5' 4.57" (1.64 m)   Wt 197 lb (89.4 kg)   SpO2 96%   BMI 33.22 kg/m  Wt Readings from Last 3 Encounters:  02/27/19 197 lb (89.4 kg)  02/06/19 194 lb (88 kg)  04/25/18 199 lb 3.2 oz (90.4 kg)     Health  Maintenance Due  Topic Date Due  . HEMOGLOBIN A1C  01-04-1951  . Hepatitis C Screening  12/22/1950  . OPHTHALMOLOGY EXAM  01/26/1963  . MAMMOGRAM  07/14/2017  . COLONOSCOPY  11/13/2017  . DEXA SCAN  01/25/2018  . PNA vac Low Risk Adult (1 of 2 -  PCV13) 01/25/2018  . TETANUS/TDAP  01/01/2019  . INFLUENZA VACCINE  02/21/2019    There are no preventive care reminders to display for this patient.  No results found for: TSH Lab Results  Component Value Date   WBC 7.8 01/23/2018   HGB 8.8 (L) 01/23/2018   HCT 29.0 (L) 01/23/2018   MCV 87.6 01/23/2018   PLT 253 01/23/2018   Lab Results  Component Value Date   NA 137 01/23/2018   K 5.0 01/23/2018   CO2 16 (L) 01/23/2018   GLUCOSE 211 (H) 01/23/2018   BUN 80 (H) 01/23/2018   CREATININE 3.11 (H) 01/23/2018   BILITOT <0.2 (L) 04/21/2014   ALKPHOS 71 04/21/2014   AST 23 04/21/2014   ALT 28 04/21/2014   PROT 7.1 04/21/2014   ALBUMIN 3.2 (L) 04/21/2014   CALCIUM 8.6 (L) 01/23/2018   ANIONGAP 10 01/23/2018   No results found for: CHOL No results found for: HDL No results found for: LDLCALC No results found for: TRIG No results found for: CHOLHDL No results found for: HGBA1C    Assessment & Plan:   Problem List Items Addressed This Visit      Endocrine   T2DM (type 2 diabetes mellitus) ([place])   Relevant Orders   Hemoglobin A1c    Other Visit Diagnoses    Screening for endocrine, metabolic and immunity disorder    -  Primary   Relevant Orders   CBC with Differential/Platelet   Comprehensive metabolic panel   Hemoglobin A1c   Lipid screening       Relevant Orders   Lipid panel   Screening for deficiency anemia       Relevant Orders   CBC with Differential/Platelet   Stage 4 chronic kidney disease ([place])       Relevant Orders   CBC with Differential/Platelet   Comprehensive metabolic panel      No orders of the defined types were placed in this encounter.   Follow-up: No follow-ups on file.   PLAN  Ms  Fittipaldi has a notably more relaxed disposition at today's visit - whether related to the Vistaril or not, this represents a positive change for her. She endorses that this has been representative of an overall positive change in the previous 3 weeks.   Continue Vistaril 10mg  PO qd PRN for anxiety. Call for refills as needed.  Labs drawn today, will follow up as warranted.  3 month follow up  Patient encouraged to call clinic with any questions, comments, or concerns.   Maximiano Coss, NP

## 2019-02-28 LAB — COMPREHENSIVE METABOLIC PANEL
ALT: 21 IU/L (ref 0–32)
AST: 20 IU/L (ref 0–40)
Albumin/Globulin Ratio: 1.5 (ref 1.2–2.2)
Albumin: 3.8 g/dL (ref 3.8–4.8)
Alkaline Phosphatase: 54 IU/L (ref 39–117)
BUN/Creatinine Ratio: 14 (ref 12–28)
BUN: 38 mg/dL — ABNORMAL HIGH (ref 8–27)
Bilirubin Total: <0.2 mg/dL (ref 0.0–1.2)
CO2: 20 mmol/L (ref 20–29)
Calcium: 8.5 mg/dL — ABNORMAL LOW (ref 8.7–10.3)
Chloride: 98 mmol/L (ref 96–106)
Creatinine, Ser: 2.68 mg/dL — ABNORMAL HIGH (ref 0.57–1.00)
GFR calc Af Amer: 21 mL/min/{1.73_m2} — ABNORMAL LOW (ref >59)
GFR calc non Af Amer: 18 mL/min/{1.73_m2} — ABNORMAL LOW (ref >59)
Globulin, Total: 2.5 g/dL (ref 1.5–4.5)
Glucose: 241 mg/dL — ABNORMAL HIGH (ref 65–99)
Potassium: 4.7 mmol/L (ref 3.5–5.2)
Sodium: 135 mmol/L (ref 134–144)
Total Protein: 6.3 g/dL (ref 6.0–8.5)

## 2019-02-28 LAB — CBC WITH DIFFERENTIAL/PLATELET
Basophils Absolute: 0.1 10*3/uL (ref 0.0–0.2)
Basos: 1 %
EOS (ABSOLUTE): 0.1 10*3/uL (ref 0.0–0.4)
Eos: 2 %
Hematocrit: 31.1 % — ABNORMAL LOW (ref 34.0–46.6)
Hemoglobin: 9.7 g/dL — ABNORMAL LOW (ref 11.1–15.9)
Immature Grans (Abs): 0.1 10*3/uL (ref 0.0–0.1)
Immature Granulocytes: 1 %
Lymphocytes Absolute: 1.5 10*3/uL (ref 0.7–3.1)
Lymphs: 23 %
MCH: 27.1 pg (ref 26.6–33.0)
MCHC: 31.2 g/dL — ABNORMAL LOW (ref 31.5–35.7)
MCV: 87 fL (ref 79–97)
Monocytes Absolute: 0.4 10*3/uL (ref 0.1–0.9)
Monocytes: 6 %
Neutrophils Absolute: 4.6 10*3/uL (ref 1.4–7.0)
Neutrophils: 67 %
Platelets: 239 10*3/uL (ref 150–450)
RBC: 3.58 x10E6/uL — ABNORMAL LOW (ref 3.77–5.28)
RDW: 14.4 % (ref 11.7–15.4)
WBC: 6.8 10*3/uL (ref 3.4–10.8)

## 2019-02-28 LAB — HEMOGLOBIN A1C
Est. average glucose Bld gHb Est-mCnc: 249 mg/dL
Hgb A1c MFr Bld: 10.3 % — ABNORMAL HIGH (ref 4.8–5.6)

## 2019-02-28 LAB — LIPID PANEL
Chol/HDL Ratio: 4.5 ratio — ABNORMAL HIGH (ref 0.0–4.4)
Cholesterol, Total: 165 mg/dL (ref 100–199)
HDL: 37 mg/dL — ABNORMAL LOW (ref >39)
LDL Calculated: 88 mg/dL (ref 0–99)
Triglycerides: 202 mg/dL — ABNORMAL HIGH (ref 0–149)
VLDL Cholesterol Cal: 40 mg/dL (ref 5–40)

## 2019-03-02 ENCOUNTER — Other Ambulatory Visit: Payer: Self-pay | Admitting: Registered Nurse

## 2019-03-02 DIAGNOSIS — F411 Generalized anxiety disorder: Secondary | ICD-10-CM

## 2019-03-02 NOTE — Telephone Encounter (Signed)
Requested Prescriptions  Pending Prescriptions Disp Refills  . hydrOXYzine (ATARAX/VISTARIL) 10 MG tablet [Pharmacy Med Name: HYDROXYZINE HCL 10 MG TABLET] 180 tablet 0    Sig: TAKE 1 TABLET BY MOUTH TWICE A DAY     Ear, Nose, and Throat:  Antihistamines Passed - 03/02/2019  2:33 PM      Passed - Valid encounter within last 12 months    Recent Outpatient Visits          3 days ago Screening for endocrine, metabolic and immunity disorder   [facility], Delfino Lovett, NP   3 weeks ago Encounter to establish care   [facility], Delfino Lovett, NP   10 months ago Type 2 diabetes mellitus with stage 4 chronic kidney disease, without long-term current use of insulin [facility])   Primary Care at [facility], Gelene Mink, PA-C   1 year ago Restless leg syndrome   Primary Care at [facility], Gelene Mink, PA-C      Future Appointments            In 2 months Maximiano Coss, NP Primary Care at [place], [facility]

## 2019-03-02 NOTE — Progress Notes (Signed)
Good morning Amy Herrera,  Your labs have returned. Overall, we're seeing stability in the results. Your CBC is not concerning, and your CMP shows no major changes to kidney function. We are seeing some high numbers in your lipid panel and A1c. Given you other medical history, I think our best course at this time is to work on a good diet and mild physical activity. For diet, I have simple recommendations: eat real, unprocessed foods, control portion sizes, and eat mostly vegetables. As far as exercise, the AHA recommends 30 minutes of mild to moderate exercise as tolerated 3-5 days each week.  We can recheck the A1c and lipids at our next follow up and discuss a course of action from that point - we need to walk a comfortable line between controlling lipids and A1c while being kidney-friendly. If you have any questions, feel free to reach out,  Kathrin Ruddy, NP

## 2019-03-07 ENCOUNTER — Other Ambulatory Visit: Payer: Self-pay | Admitting: Family Medicine

## 2019-03-07 DIAGNOSIS — E1122 Type 2 diabetes mellitus with diabetic chronic kidney disease: Secondary | ICD-10-CM

## 2019-03-07 DIAGNOSIS — N184 Chronic kidney disease, stage 4 (severe): Secondary | ICD-10-CM

## 2019-03-11 ENCOUNTER — Other Ambulatory Visit: Payer: Self-pay | Admitting: Physician Assistant

## 2019-03-11 DIAGNOSIS — E785 Hyperlipidemia, unspecified: Secondary | ICD-10-CM

## 2019-03-11 NOTE — Telephone Encounter (Signed)
Please advise if Amy Herrera would like to take over this rx?

## 2019-03-23 DIAGNOSIS — F411 Generalized anxiety disorder: Secondary | ICD-10-CM | POA: Diagnosis not present

## 2019-03-23 DIAGNOSIS — F331 Major depressive disorder, recurrent, moderate: Secondary | ICD-10-CM | POA: Diagnosis not present

## 2019-04-02 ENCOUNTER — Other Ambulatory Visit: Payer: Self-pay | Admitting: Family Medicine

## 2019-04-02 DIAGNOSIS — N184 Chronic kidney disease, stage 4 (severe): Secondary | ICD-10-CM

## 2019-04-02 DIAGNOSIS — E1122 Type 2 diabetes mellitus with diabetic chronic kidney disease: Secondary | ICD-10-CM

## 2019-04-02 NOTE — Telephone Encounter (Signed)
Requested medication (s) are due for refill today: yes  Requested medication (s) are on the active medication list: yes  Last refill:  03/07/2019  Future visit scheduled: yes  Notes to clinic:  Review for refill   Requested Prescriptions  Pending Prescriptions Disp Refills   repaglinide (PRANDIN) 0.5 MG tablet [Pharmacy Med Name: REPAGLINIDE 0.5 MG TABLET] 90 tablet 0    Sig: TAKE 1 TABLET BY MOUTH [place]     Endocrinology:  Diabetes - Meglitinides Failed - 04/02/2019  2:32 PM      Failed - HBA1C is between 0 and 7.9 and within 180 days    Hgb A1c MFr Bld  Date Value Ref Range Status  02/27/2019 10.3 (H) 4.8 - 5.6 % Final    Comment:             Prediabetes: 5.7 - 6.4          Diabetes: >6.4          Glycemic control for adults with diabetes: <7.0          Passed - Valid encounter within last 6 months    Recent Outpatient Visits          1 month ago Screening for endocrine, metabolic and immunity disorder   [facility], Delfino Lovett, NP   1 month ago Encounter to establish care   [facility] at [facility], Delfino Lovett, NP   11 months ago Type 2 diabetes mellitus with stage 4 chronic kidney disease, without long-term current use of insulin [facility])   Primary Care at [facility], Gelene Mink, PA-C   1 year ago Restless leg syndrome   Primary Care at [facility], Gelene Mink, PA-C      Future Appointments            In 1 month Maximiano Coss, NP Primary Care at [place], [facility]

## 2019-04-08 DIAGNOSIS — F411 Generalized anxiety disorder: Secondary | ICD-10-CM | POA: Diagnosis not present

## 2019-04-08 DIAGNOSIS — F331 Major depressive disorder, recurrent, moderate: Secondary | ICD-10-CM | POA: Diagnosis not present

## 2019-04-10 ENCOUNTER — Other Ambulatory Visit: Payer: Self-pay | Admitting: Family Medicine

## 2019-04-10 DIAGNOSIS — N184 Chronic kidney disease, stage 4 (severe): Secondary | ICD-10-CM

## 2019-04-10 DIAGNOSIS — E1122 Type 2 diabetes mellitus with diabetic chronic kidney disease: Secondary | ICD-10-CM

## 2019-04-14 ENCOUNTER — Other Ambulatory Visit: Payer: Self-pay | Admitting: Family Medicine

## 2019-04-14 DIAGNOSIS — G2581 Restless legs syndrome: Secondary | ICD-10-CM

## 2019-04-14 DIAGNOSIS — F411 Generalized anxiety disorder: Secondary | ICD-10-CM

## 2019-04-14 NOTE — Telephone Encounter (Signed)
Requested medication (s) are due for refill today: yes  Requested medication (s) are on the active medication list: yes  Last refill:  01/29/2019  Future visit scheduled: yes  Notes to clinic:  Review for refill   Requested Prescriptions  Pending Prescriptions Disp Refills   sertraline (ZOLOFT) 100 MG tablet [Pharmacy Med Name: SERTRALINE HCL 100 MG TABLET] 90 tablet 1    Sig: TAKE 1 TABLET BY MOUTH EVERYDAY AT BEDTIME     Psychiatry:  Antidepressants - SSRI Failed - 04/14/2019 10:17 AM      Failed - Completed PHQ-2 or PHQ-9 in the last 360 days.      Passed - Valid encounter within last 6 months    Recent Outpatient Visits          1 month ago Screening for endocrine, metabolic and immunity disorder   [facility] at [facility], Delfino Lovett, NP   2 months ago Encounter to establish care   [facility] at [facility], Delfino Lovett, NP   11 months ago Type 2 diabetes mellitus with stage 4 chronic kidney disease, without long-term current use of insulin [facility])   Primary Care at [facility], Gelene Mink, PA-C   1 year ago Restless leg syndrome   Primary Care at [facility], Gelene Mink, PA-C      Future Appointments            In 1 month Maximiano Coss, NP Primary Care at [place], [place]            pramipexole (MIRAPEX) 0.5 MG tablet [Pharmacy Med Name: PRAMIPEXOLE 0.5 MG TABLET] 90 tablet 0    Sig: TAKE 1 TABLET BY MOUTH EVERYDAY AT BEDTIME     Neurology:  Parkinsonian Agents Failed - 04/14/2019 10:17 AM      Failed - Last BP in normal range    BP Readings from Last 1 Encounters:  02/27/19 (!) 160/68         Passed - Valid encounter within last 12 months    Recent Outpatient Visits          1 month ago Screening for endocrine, metabolic and immunity disorder   [facility] at [facility], Delfino Lovett, NP   2 months ago Encounter to establish care   [facility] at [facility], Delfino Lovett, NP   11 months ago Type 2 diabetes mellitus with stage 4 chronic kidney  disease, without long-term current use of insulin [facility])   Primary Care at [facility], Gelene Mink, PA-C   1 year ago Restless leg syndrome   Primary Care at [facility], Gelene Mink, PA-C      Future Appointments            In 1 month Maximiano Coss, NP Primary Care at [place], [facility]

## 2019-05-07 ENCOUNTER — Other Ambulatory Visit: Payer: Self-pay | Admitting: Family Medicine

## 2019-05-07 DIAGNOSIS — N184 Chronic kidney disease, stage 4 (severe): Secondary | ICD-10-CM

## 2019-05-07 DIAGNOSIS — E1122 Type 2 diabetes mellitus with diabetic chronic kidney disease: Secondary | ICD-10-CM

## 2019-05-12 DIAGNOSIS — F411 Generalized anxiety disorder: Secondary | ICD-10-CM | POA: Diagnosis not present

## 2019-05-12 DIAGNOSIS — F331 Major depressive disorder, recurrent, moderate: Secondary | ICD-10-CM | POA: Diagnosis not present

## 2019-05-22 ENCOUNTER — Other Ambulatory Visit: Payer: Self-pay | Admitting: Registered Nurse

## 2019-05-22 ENCOUNTER — Other Ambulatory Visit: Payer: Self-pay | Admitting: Family Medicine

## 2019-05-22 DIAGNOSIS — N184 Chronic kidney disease, stage 4 (severe): Secondary | ICD-10-CM

## 2019-05-22 DIAGNOSIS — F411 Generalized anxiety disorder: Secondary | ICD-10-CM

## 2019-05-22 DIAGNOSIS — E1122 Type 2 diabetes mellitus with diabetic chronic kidney disease: Secondary | ICD-10-CM

## 2019-05-29 ENCOUNTER — Encounter: Payer: Self-pay | Admitting: Registered Nurse

## 2019-05-29 ENCOUNTER — Other Ambulatory Visit: Payer: Self-pay

## 2019-05-29 ENCOUNTER — Ambulatory Visit (INDEPENDENT_AMBULATORY_CARE_PROVIDER_SITE_OTHER): Payer: Medicare Other | Admitting: Registered Nurse

## 2019-05-29 DIAGNOSIS — F411 Generalized anxiety disorder: Secondary | ICD-10-CM | POA: Diagnosis not present

## 2019-05-29 MED ORDER — HYDROXYZINE HCL 10 MG PO TABS: 10.0000 mg | ORAL_TABLET | Freq: Two times a day (BID) | ORAL | 1 refills | Status: DC

## 2019-05-29 NOTE — Progress Notes (Signed)
Established Patient Office Visit  Subjective:  Patient ID: Amy Herrera, female    DOB: 1950-11-03  Age: 68 y.o. MRN: [id number]  CC:  Chief Complaint  Patient presents with  . Chronic Conditions    3 month follow-up     HPI Amy Herrera presents for med check for hydroxyzine.   She had presented originally in May for [facility] and anxiety - given her Stage IV CKD, we had limited options, and decided for hydroxyzine for episodic and situational anxiety that had been worsened by the pandemic.   She returned in 2 weeks for follow up and reported good effect. Notes that she is still experiencing good effect. Does not take daily. Has not taken at all this week - still feels stress and anxiety, but feels in control. Denies any anticholinergic side effects - though does note that her longstanding history of ENT issues seems to be flaring up.  Past Medical History:  Diagnosis Date  . Anxiety   . Asthma   . Depression   . GERD (gastroesophageal reflux disease)   . Hypertension   . Obesity   . Tobacco abuse   . Type II or unspecified type diabetes mellitus without mention of complication, not stated as uncontrolled     Past Surgical History:  Procedure Laterality Date  . TONSILLECTOMY    . TUBAL LIGATION      Family History  Problem Relation Age of Onset  . Cancer Mother        lung  . Heart attack Father   . Heart disease Father   . Heart disease Sister   . Hypertension Sister   . Muscular dystrophy Son   . Muscular dystrophy Cousin   . Muscular dystrophy Cousin   . Muscular dystrophy Cousin     Social History   Socioeconomic History  . Marital status: Widowed    Spouse name: Not on file  . Number of children: Not on file  . Years of education: Not on file  . Highest education level: Not on file  Occupational History  . Not on file  Social Needs  . Financial resource strain: Not hard at all  . Food insecurity    Worry: Never true    Inability: Never true  .  Transportation needs    Medical: No    Non-medical: No  Tobacco Use  . Smoking status: Former Smoker    Packs/day: 0.50    Years: 40.00    Pack years: 20.00    Types: Cigarettes    Quit date: 10/30/2015    Years since quitting: 3.5  . Smokeless tobacco: Never Used  Substance and Sexual Activity  . Alcohol use: No  . Drug use: No  . Sexual activity: Not Currently    Partners: Male  Lifestyle  . Physical activity    Days per week: 0 days    Minutes per session: 0 min  . Stress: Very much  Relationships  . Social Herbalist on phone: Three times a week    Gets together: Twice a week    Attends religious service: Never    Active member of club or organization: No    Attends meetings of clubs or organizations: Never    Relationship status: Widowed  . Intimate partner violence    Fear of current or ex partner: No    Emotionally abused: No    Physically abused: No    Forced sexual activity: No  Other  Topics Concern  . Not on file  Social History Narrative  . Not on file    Outpatient Medications Prior to Visit  Medication Sig Dispense Refill  . aspirin 81 MG tablet Take 81 mg by mouth daily.    Marland Kitchen atorvastatin (LIPITOR) 20 MG tablet TAKE 1 TABLET BY MOUTH EVERYDAY AT BEDTIME 90 tablet 3  . furosemide (LASIX) 80 MG tablet Take 40 mg by mouth daily as needed.    . hydrALAZINE (APRESOLINE) 100 MG tablet     . lisinopril-hydrochlorothiazide (PRINZIDE,ZESTORETIC) 20-12.5 MG per tablet Take 1 tablet by mouth 2 (two) times daily.     . pramipexole (MIRAPEX) 0.5 MG tablet TAKE 1 TABLET BY MOUTH EVERYDAY AT BEDTIME 90 tablet 0  . repaglinide (PRANDIN) 0.5 MG tablet TAKE 1 TABLET BY MOUTH 3 TIMES DAILY BEFORE MEALS 90 tablet 0  . sertraline (ZOLOFT) 100 MG tablet TAKE 1 TABLET BY MOUTH EVERYDAY AT BEDTIME 90 tablet 1  . sodium bicarbonate 650 MG tablet     . hydrOXYzine (ATARAX/VISTARIL) 10 MG tablet TAKE 1 TABLET BY MOUTH TWICE A DAY 180 tablet 0   No  facility-administered medications prior to visit.     Allergies  Allergen Reactions  . Sulfa Antibiotics Swelling    Face/mouth  . Codeine Hives    ROS Review of Systems  Constitutional: Negative.   HENT: Negative.   Eyes: Negative.   Respiratory: Negative.   Cardiovascular: Negative.   Gastrointestinal: Negative.   Endocrine: Negative.   Musculoskeletal: Negative.   Skin: Negative.   Allergic/Immunologic: Negative.   Neurological: Positive for dizziness (inner ear issue, had workup with ent in past). Negative for syncope and headaches.  Hematological: Negative.   Psychiatric/Behavioral: Negative.   All other systems reviewed and are negative.     Objective:    Physical Exam  Constitutional: She is oriented to person, place, and time. She appears well-developed and well-nourished. No distress.  Cardiovascular: Normal rate and regular rhythm.  Pulmonary/Chest: Effort normal and breath sounds normal. No respiratory distress.  Neurological: She is alert and oriented to person, place, and time.  Skin: Skin is warm and dry. No rash noted. She is not diaphoretic. No erythema. No pallor.  Psychiatric: She has a normal mood and affect. Her behavior is normal. Judgment and thought content normal.  Nursing note and vitals reviewed.   BP (!) 158/72   Pulse 100   Temp 99 F (37.2 C) (Oral)   Resp 16   Wt 197 lb (89.4 kg)   SpO2 95%   BMI 33.22 kg/m  Wt Readings from Last 3 Encounters:  05/29/19 197 lb (89.4 kg)  02/27/19 197 lb (89.4 kg)  02/06/19 194 lb (88 kg)     Health Maintenance Due  Topic Date Due  . Hepatitis C Screening  1951-06-15  . OPHTHALMOLOGY EXAM  01/26/1963  . MAMMOGRAM  07/14/2017  . COLONOSCOPY  11/13/2017  . DEXA SCAN  01/25/2018  . PNA vac Low Risk Adult (1 of 2 - PCV13) 01/25/2018  . TETANUS/TDAP  01/01/2019    There are no preventive care reminders to display for this patient.  No results found for: TSH Lab Results  Component Value  Date   WBC 6.8 02/27/2019   HGB 9.7 (L) 02/27/2019   HCT 31.1 (L) 02/27/2019   MCV 87 02/27/2019   PLT 239 02/27/2019   Lab Results  Component Value Date   NA 135 02/27/2019   K 4.7 02/27/2019   CO2 20 02/27/2019  GLUCOSE 241 (H) 02/27/2019   BUN 38 (H) 02/27/2019   CREATININE 2.68 (H) 02/27/2019   BILITOT <0.2 02/27/2019   ALKPHOS 54 02/27/2019   AST 20 02/27/2019   ALT 21 02/27/2019   PROT 6.3 02/27/2019   ALBUMIN 3.8 02/27/2019   CALCIUM 8.5 (L) 02/27/2019   ANIONGAP 10 01/23/2018   Lab Results  Component Value Date   CHOL 165 02/27/2019   Lab Results  Component Value Date   HDL 37 (L) 02/27/2019   Lab Results  Component Value Date   LDLCALC 88 02/27/2019   Lab Results  Component Value Date   TRIG 202 (H) 02/27/2019   Lab Results  Component Value Date   CHOLHDL 4.5 (H) 02/27/2019   Lab Results  Component Value Date   HGBA1C 10.3 (H) 02/27/2019      Assessment & Plan:   Problem List Items Addressed This Visit      Other   Generalized anxiety disorder   Relevant Medications   hydrOXYzine (ATARAX/VISTARIL) 10 MG tablet      Meds ordered this encounter  Medications  . hydrOXYzine (ATARAX/VISTARIL) 10 MG tablet    Sig: Take 1 tablet (10 mg total) by mouth 2 (two) times daily.    Dispense:  180 tablet    Refill:  1    Order Specific Question:   Supervising Provider    Answer:   Forrest Moron [id number]    Follow-up: No follow-ups on file.   PLAN  Refill Vistaril for 48mos  Seeing good effect. Again reviewed side effects and reasons to return to clinic  Patient encouraged to call clinic with any questions, comments, or concerns.   Maximiano Coss, NP

## 2019-05-29 NOTE — Patient Instructions (Signed)
° ° ° °  If you have lab work done today you will be contacted with your lab results within the next 2 weeks.  If you have not heard from us then please contact us. The fastest way to get your results is to register for My Chart. ° ° °IF you received an x-ray today, you will receive an invoice from [facility]. Please contact [facility] at [phone] with questions or concerns regarding your invoice.  ° °IF you received labwork today, you will receive an invoice from [facility]. Please contact [facility] at [phone] with questions or concerns regarding your invoice.  ° °Our billing staff will not be able to assist you with questions regarding bills from these companies. ° °You will be contacted with the lab results as soon as they are available. The fastest way to get your results is to activate your My Chart account. Instructions are located on the last page of this paperwork. If you have not heard from us regarding the results in 2 weeks, please contact this office. °  ° ° ° °

## 2019-06-17 DIAGNOSIS — N184 Chronic kidney disease, stage 4 (severe): Secondary | ICD-10-CM | POA: Diagnosis not present

## 2019-06-17 DIAGNOSIS — N2581 Secondary hyperparathyroidism of renal origin: Secondary | ICD-10-CM | POA: Diagnosis not present

## 2019-06-17 DIAGNOSIS — E872 Acidosis: Secondary | ICD-10-CM | POA: Diagnosis not present

## 2019-06-17 DIAGNOSIS — I129 Hypertensive chronic kidney disease with stage 1 through stage 4 chronic kidney disease, or unspecified chronic kidney disease: Secondary | ICD-10-CM | POA: Diagnosis not present

## 2019-06-17 DIAGNOSIS — D631 Anemia in chronic kidney disease: Secondary | ICD-10-CM | POA: Diagnosis not present

## 2019-06-17 DIAGNOSIS — E875 Hyperkalemia: Secondary | ICD-10-CM | POA: Diagnosis not present

## 2019-06-17 DIAGNOSIS — R809 Proteinuria, unspecified: Secondary | ICD-10-CM | POA: Diagnosis not present

## 2019-06-21 ENCOUNTER — Other Ambulatory Visit: Payer: Self-pay | Admitting: Registered Nurse

## 2019-06-21 DIAGNOSIS — E1122 Type 2 diabetes mellitus with diabetic chronic kidney disease: Secondary | ICD-10-CM

## 2019-06-21 DIAGNOSIS — N184 Chronic kidney disease, stage 4 (severe): Secondary | ICD-10-CM

## 2019-07-01 ENCOUNTER — Other Ambulatory Visit: Payer: Self-pay | Admitting: Registered Nurse

## 2019-07-01 DIAGNOSIS — G2581 Restless legs syndrome: Secondary | ICD-10-CM

## 2019-07-08 DIAGNOSIS — F331 Major depressive disorder, recurrent, moderate: Secondary | ICD-10-CM | POA: Diagnosis not present

## 2019-07-08 DIAGNOSIS — F411 Generalized anxiety disorder: Secondary | ICD-10-CM | POA: Diagnosis not present

## 2019-07-18 ENCOUNTER — Other Ambulatory Visit: Payer: Self-pay | Admitting: Registered Nurse

## 2019-07-18 DIAGNOSIS — E1122 Type 2 diabetes mellitus with diabetic chronic kidney disease: Secondary | ICD-10-CM

## 2019-07-18 DIAGNOSIS — N184 Chronic kidney disease, stage 4 (severe): Secondary | ICD-10-CM

## 2019-08-06 DIAGNOSIS — I129 Hypertensive chronic kidney disease with stage 1 through stage 4 chronic kidney disease, or unspecified chronic kidney disease: Secondary | ICD-10-CM | POA: Diagnosis not present

## 2019-08-06 DIAGNOSIS — E875 Hyperkalemia: Secondary | ICD-10-CM | POA: Diagnosis not present

## 2019-08-06 DIAGNOSIS — E872 Acidosis: Secondary | ICD-10-CM | POA: Diagnosis not present

## 2019-08-06 DIAGNOSIS — R809 Proteinuria, unspecified: Secondary | ICD-10-CM | POA: Diagnosis not present

## 2019-08-06 DIAGNOSIS — N184 Chronic kidney disease, stage 4 (severe): Secondary | ICD-10-CM | POA: Diagnosis not present

## 2019-08-06 DIAGNOSIS — N2581 Secondary hyperparathyroidism of renal origin: Secondary | ICD-10-CM | POA: Diagnosis not present

## 2019-08-19 DIAGNOSIS — R809 Proteinuria, unspecified: Secondary | ICD-10-CM | POA: Diagnosis not present

## 2019-08-19 DIAGNOSIS — N189 Chronic kidney disease, unspecified: Secondary | ICD-10-CM | POA: Diagnosis not present

## 2019-08-19 DIAGNOSIS — E875 Hyperkalemia: Secondary | ICD-10-CM | POA: Diagnosis not present

## 2019-08-19 DIAGNOSIS — N2581 Secondary hyperparathyroidism of renal origin: Secondary | ICD-10-CM | POA: Diagnosis not present

## 2019-08-19 DIAGNOSIS — E872 Acidosis: Secondary | ICD-10-CM | POA: Diagnosis not present

## 2019-08-19 DIAGNOSIS — D631 Anemia in chronic kidney disease: Secondary | ICD-10-CM | POA: Diagnosis not present

## 2019-08-19 DIAGNOSIS — I129 Hypertensive chronic kidney disease with stage 1 through stage 4 chronic kidney disease, or unspecified chronic kidney disease: Secondary | ICD-10-CM | POA: Diagnosis not present

## 2019-08-19 DIAGNOSIS — N184 Chronic kidney disease, stage 4 (severe): Secondary | ICD-10-CM | POA: Diagnosis not present

## 2019-08-22 ENCOUNTER — Other Ambulatory Visit: Payer: Self-pay | Admitting: Registered Nurse

## 2019-08-22 DIAGNOSIS — E1122 Type 2 diabetes mellitus with diabetic chronic kidney disease: Secondary | ICD-10-CM

## 2019-08-22 DIAGNOSIS — N184 Chronic kidney disease, stage 4 (severe): Secondary | ICD-10-CM

## 2019-09-18 ENCOUNTER — Other Ambulatory Visit: Payer: Self-pay | Admitting: Registered Nurse

## 2019-09-18 DIAGNOSIS — N184 Chronic kidney disease, stage 4 (severe): Secondary | ICD-10-CM

## 2019-09-18 DIAGNOSIS — E1122 Type 2 diabetes mellitus with diabetic chronic kidney disease: Secondary | ICD-10-CM

## 2019-09-20 ENCOUNTER — Ambulatory Visit: Payer: Medicare Other | Attending: Internal Medicine

## 2019-09-20 DIAGNOSIS — Z23 Encounter for immunization: Secondary | ICD-10-CM | POA: Insufficient documentation

## 2019-09-20 NOTE — Progress Notes (Signed)
   Covid-19 Vaccination Clinic  Name:  Amy Herrera    MRN: [id number] DOB: 1950-11-18  09/20/2019  Amy Herrera was observed post Covid-19 immunization for 15 minutes without incidence. She was provided with Vaccine Information Sheet and instruction to access the V-Safe system.   Amy Herrera was instructed to call 911 with any severe reactions post vaccine: Marland Kitchen Difficulty breathing  . Swelling of your face and throat  . A fast heartbeat  . A bad rash all over your body  . Dizziness and weakness    Immunizations Administered    Name Date Dose VIS Date Route   Pfizer COVID-19 Vaccine 09/20/2019 10:55 AM 0.3 mL 07/03/2019 Intramuscular   Manufacturer: [place]   Lot: [id number]   [place]: [id number]

## 2019-10-14 ENCOUNTER — Ambulatory Visit: Payer: Medicare Other | Attending: Internal Medicine

## 2019-10-14 DIAGNOSIS — Z23 Encounter for immunization: Secondary | ICD-10-CM

## 2019-10-14 NOTE — Progress Notes (Signed)
   Covid-19 Vaccination Clinic  Name:  Amy Herrera    MRN: [id number] DOB: 03-Jan-1951  10/14/2019  Ms. Amy Herrera was observed post Covid-19 immunization for 15 minutes without incident. She was provided with Vaccine Information Sheet and instruction to access the V-Safe system.   Amy Herrera was instructed to call 911 with any severe reactions post vaccine: Marland Kitchen Difficulty breathing  . Swelling of face and throat  . A fast heartbeat  . A bad rash all over body  . Dizziness and weakness   Immunizations Administered    Name Date Dose VIS Date Route   Pfizer COVID-19 Vaccine 10/14/2019  3:04 PM 0.3 mL 07/03/2019 Intramuscular   Manufacturer: [place]   Lot: [id number]   [place]: [id number]

## 2019-10-15 ENCOUNTER — Other Ambulatory Visit: Payer: Self-pay | Admitting: Registered Nurse

## 2019-10-15 DIAGNOSIS — F411 Generalized anxiety disorder: Secondary | ICD-10-CM

## 2019-10-17 ENCOUNTER — Other Ambulatory Visit: Payer: Self-pay | Admitting: Registered Nurse

## 2019-10-17 DIAGNOSIS — G2581 Restless legs syndrome: Secondary | ICD-10-CM

## 2019-10-19 NOTE — Telephone Encounter (Signed)
Patient is requesting a refill of the following medications: Requested Prescriptions   Pending Prescriptions Disp Refills  . pramipexole (MIRAPEX) 0.5 MG tablet [Pharmacy Med Name: PRAMIPEXOLE 0.5 MG TABLET] 90 tablet 0    Sig: TAKE 1 TABLET BY MOUTH EVERYDAY AT BEDTIME    Date of patient request: 10/19/19 Last office visit: 05/29/19 Date of last refill: 07/01/19 Last refill amount:90 + 0 Follow up time period per chart: 11/27/19  Okay to refill for restless leg?

## 2019-10-20 ENCOUNTER — Other Ambulatory Visit: Payer: Self-pay | Admitting: Registered Nurse

## 2019-10-20 DIAGNOSIS — E1122 Type 2 diabetes mellitus with diabetic chronic kidney disease: Secondary | ICD-10-CM

## 2019-10-20 DIAGNOSIS — N184 Chronic kidney disease, stage 4 (severe): Secondary | ICD-10-CM

## 2019-11-12 ENCOUNTER — Other Ambulatory Visit: Payer: Self-pay | Admitting: Registered Nurse

## 2019-11-12 DIAGNOSIS — N184 Chronic kidney disease, stage 4 (severe): Secondary | ICD-10-CM

## 2019-11-12 DIAGNOSIS — E1122 Type 2 diabetes mellitus with diabetic chronic kidney disease: Secondary | ICD-10-CM

## 2019-11-26 ENCOUNTER — Telehealth: Payer: Self-pay | Admitting: *Deleted

## 2019-11-26 NOTE — Telephone Encounter (Signed)
Schedule AWV.  

## 2019-11-27 ENCOUNTER — Ambulatory Visit: Payer: Medicare Other | Admitting: Registered Nurse

## 2019-11-30 ENCOUNTER — Encounter: Payer: Self-pay | Admitting: Registered Nurse

## 2019-12-08 ENCOUNTER — Other Ambulatory Visit: Payer: Self-pay | Admitting: Registered Nurse

## 2019-12-08 DIAGNOSIS — E1122 Type 2 diabetes mellitus with diabetic chronic kidney disease: Secondary | ICD-10-CM

## 2019-12-08 DIAGNOSIS — N184 Chronic kidney disease, stage 4 (severe): Secondary | ICD-10-CM

## 2019-12-08 NOTE — Telephone Encounter (Signed)
Left voicemail for patient, a follow up appointment is needed.

## 2019-12-30 ENCOUNTER — Other Ambulatory Visit: Payer: Self-pay | Admitting: Registered Nurse

## 2019-12-30 DIAGNOSIS — G2581 Restless legs syndrome: Secondary | ICD-10-CM

## 2020-01-06 ENCOUNTER — Other Ambulatory Visit: Payer: Self-pay | Admitting: Registered Nurse

## 2020-01-06 DIAGNOSIS — E785 Hyperlipidemia, unspecified: Secondary | ICD-10-CM

## 2020-01-07 ENCOUNTER — Other Ambulatory Visit: Payer: Self-pay

## 2020-01-07 DIAGNOSIS — E1122 Type 2 diabetes mellitus with diabetic chronic kidney disease: Secondary | ICD-10-CM

## 2020-01-07 DIAGNOSIS — N184 Chronic kidney disease, stage 4 (severe): Secondary | ICD-10-CM

## 2020-01-07 MED ORDER — REPAGLINIDE 0.5 MG PO TABS: ORAL_TABLET | ORAL | 0 refills | Status: DC

## 2020-02-08 ENCOUNTER — Other Ambulatory Visit: Payer: Self-pay | Admitting: Registered Nurse

## 2020-02-08 DIAGNOSIS — E1122 Type 2 diabetes mellitus with diabetic chronic kidney disease: Secondary | ICD-10-CM

## 2020-02-08 DIAGNOSIS — N184 Chronic kidney disease, stage 4 (severe): Secondary | ICD-10-CM

## 2020-02-08 NOTE — Telephone Encounter (Signed)
Please schedule f/u appt and med refill

## 2020-02-08 NOTE — Telephone Encounter (Signed)
Called pt Amy Herrera please advice

## 2020-02-10 ENCOUNTER — Other Ambulatory Visit: Payer: Self-pay | Admitting: Registered Nurse

## 2020-02-10 DIAGNOSIS — E1122 Type 2 diabetes mellitus with diabetic chronic kidney disease: Secondary | ICD-10-CM

## 2020-02-10 DIAGNOSIS — N184 Chronic kidney disease, stage 4 (severe): Secondary | ICD-10-CM

## 2020-02-10 NOTE — Telephone Encounter (Signed)
Courtesy refill patient needs an OV.

## 2020-03-04 ENCOUNTER — Other Ambulatory Visit: Payer: Self-pay | Admitting: Registered Nurse

## 2020-03-04 DIAGNOSIS — N184 Chronic kidney disease, stage 4 (severe): Secondary | ICD-10-CM

## 2020-03-04 DIAGNOSIS — E1122 Type 2 diabetes mellitus with diabetic chronic kidney disease: Secondary | ICD-10-CM

## 2020-03-28 ENCOUNTER — Other Ambulatory Visit: Payer: Self-pay | Admitting: Registered Nurse

## 2020-03-28 DIAGNOSIS — G2581 Restless legs syndrome: Secondary | ICD-10-CM

## 2020-03-29 ENCOUNTER — Other Ambulatory Visit: Payer: Self-pay | Admitting: Registered Nurse

## 2020-03-29 DIAGNOSIS — G2581 Restless legs syndrome: Secondary | ICD-10-CM

## 2020-03-30 ENCOUNTER — Other Ambulatory Visit: Payer: Self-pay | Admitting: Registered Nurse

## 2020-03-30 DIAGNOSIS — N184 Chronic kidney disease, stage 4 (severe): Secondary | ICD-10-CM

## 2020-03-30 DIAGNOSIS — E1122 Type 2 diabetes mellitus with diabetic chronic kidney disease: Secondary | ICD-10-CM

## 2020-03-30 NOTE — Telephone Encounter (Signed)
Pt will need an appointment in order to get his medication refilled.   He was last seen in 05/29/19 and he has already received a curtsy.

## 2020-03-30 NOTE — Telephone Encounter (Signed)
03/30/2020 - PATIENT REQUESTING A REFILL ON HER PRAMIPEXOLE 0.5 MG. I TRIED TO CALL AND SCHEDULE AN APPOINTMENT WITH RICH MORROW BUT I HAD TO LEAVE A MESSAGE ON HER VOICE MAIL TO RETURN MY CALL. I WILL NOT ROUTE BACK TO THE CLINICAL TEAM AT THIS TIME BECAUSE SHE HAS ALREADY GOTTEN A COURTESY REFILL. [place]

## 2020-04-29 ENCOUNTER — Other Ambulatory Visit: Payer: Self-pay | Admitting: Registered Nurse

## 2020-04-29 DIAGNOSIS — G2581 Restless legs syndrome: Secondary | ICD-10-CM

## 2020-05-01 ENCOUNTER — Other Ambulatory Visit: Payer: Self-pay | Admitting: Registered Nurse

## 2020-05-01 DIAGNOSIS — N184 Chronic kidney disease, stage 4 (severe): Secondary | ICD-10-CM

## 2020-05-01 DIAGNOSIS — E1122 Type 2 diabetes mellitus with diabetic chronic kidney disease: Secondary | ICD-10-CM

## 2020-05-01 NOTE — Telephone Encounter (Signed)
Requested Prescriptions  Pending Prescriptions Disp Refills  . repaglinide (PRANDIN) 0.5 MG tablet [Pharmacy Med Name: REPAGLINIDE 0.5 MG TABLET] 12 tablet 0    Sig: TAKE 1 TABLET BY MOUTH 3 TIMES DAILY BEFORE MEALS     Endocrinology:  Diabetes - Meglitinides Failed - 05/01/2020 10:31 AM      Failed - HBA1C is between 0 and 7.9 and within 180 days    Hgb A1c MFr Bld  Date Value Ref Range Status  02/27/2019 10.3 (H) 4.8 - 5.6 % Final    Comment:             Prediabetes: 5.7 - 6.4          Diabetes: >6.4          Glycemic control for adults with diabetes: <7.0          Failed - Valid encounter within last 6 months    Recent Outpatient Visits          11 months ago Generalized anxiety disorder   [facility], Delfino Lovett, NP   1 year ago Screening for endocrine, metabolic and immunity disorder   [facility], Delfino Lovett, NP   1 year ago Encounter to establish care   [facility] [place], NP   2 years ago Type 2 diabetes mellitus with stage 4 chronic kidney disease, without long-term current use of insulin [facility])   Primary [facility] at [facility], Gelene Mink, PA-C   2 years ago Restless leg syndrome   Primary Care at [facility] Gelene Mink, PA-C      Future Appointments            In 3 days Maximiano Coss, NP Primary [facility] at [place], [facility]

## 2020-05-04 ENCOUNTER — Other Ambulatory Visit: Payer: Self-pay

## 2020-05-04 ENCOUNTER — Encounter: Payer: Self-pay | Admitting: Registered Nurse

## 2020-05-04 ENCOUNTER — Ambulatory Visit (INDEPENDENT_AMBULATORY_CARE_PROVIDER_SITE_OTHER): Payer: Medicare Other | Admitting: Registered Nurse

## 2020-05-04 VITALS — BP 189/93 | HR 82 | Temp 98.2°F | Resp 18 | Ht 65.0 in | Wt 203.2 lb

## 2020-05-04 DIAGNOSIS — E119 Type 2 diabetes mellitus without complications: Secondary | ICD-10-CM | POA: Diagnosis not present

## 2020-05-04 DIAGNOSIS — Z13220 Encounter for screening for lipoid disorders: Secondary | ICD-10-CM | POA: Diagnosis not present

## 2020-05-04 DIAGNOSIS — E785 Hyperlipidemia, unspecified: Secondary | ICD-10-CM

## 2020-05-04 DIAGNOSIS — G2581 Restless legs syndrome: Secondary | ICD-10-CM

## 2020-05-04 DIAGNOSIS — Z1329 Encounter for screening for other suspected endocrine disorder: Secondary | ICD-10-CM

## 2020-05-04 DIAGNOSIS — Z0000 Encounter for general adult medical examination without abnormal findings: Secondary | ICD-10-CM

## 2020-05-04 DIAGNOSIS — F411 Generalized anxiety disorder: Secondary | ICD-10-CM | POA: Diagnosis not present

## 2020-05-04 DIAGNOSIS — N184 Chronic kidney disease, stage 4 (severe): Secondary | ICD-10-CM

## 2020-05-04 MED ORDER — SERTRALINE HCL 100 MG PO TABS: ORAL_TABLET | ORAL | 1 refills | Status: DC

## 2020-05-04 MED ORDER — HYDROXYZINE HCL 10 MG PO TABS: 10.0000 mg | ORAL_TABLET | Freq: Two times a day (BID) | ORAL | 1 refills | Status: DC

## 2020-05-04 MED ORDER — PRAMIPEXOLE DIHYDROCHLORIDE 0.5 MG PO TABS: ORAL_TABLET | ORAL | 0 refills | Status: DC

## 2020-05-04 MED ORDER — ATORVASTATIN CALCIUM 20 MG PO TABS: ORAL_TABLET | ORAL | 3 refills | Status: DC

## 2020-05-04 NOTE — Progress Notes (Signed)
Established Patient Office Visit  Subjective:  Patient ID: Amy Herrera, female    DOB: Mar 04, 1951  Age: 69 y.o. MRN: [id number]  CC:  Chief Complaint  Patient presents with  . Medication Refill    Patient states she is here for a medication refill on Pended medication. Per patient she has no questions or concerns    HPI Amy Herrera presents for medication refill  No complaints or concerns Feeling well  Notes that she is approaching ESRD. Has been suggested by her nephrologist to pursue access placement to prepare for dialysis. Patient is strongly opposed to this - has no interest in dialysis. She had worked in dialysis for 24 years and has seen the risks, benefits, and side effects. She denies suicidal ideation and states that she does not want to die but is at peace with this decision, as she knew she would have to face this since her renal disease originally started. She is requesting a referral to palliative care today to start preparations for comfort care as ESRD is coming soon.   Past Medical History:  Diagnosis Date  . Anxiety   . Asthma   . Depression   . GERD (gastroesophageal reflux disease)   . Hypertension   . Obesity   . Tobacco abuse   . Type II or unspecified type diabetes mellitus without mention of complication, not stated as uncontrolled     Past Surgical History:  Procedure Laterality Date  . TONSILLECTOMY    . TUBAL LIGATION      Family History  Problem Relation Age of Onset  . Cancer Mother        lung  . Heart attack Father   . Heart disease Father   . Heart disease Sister   . Hypertension Sister   . Muscular dystrophy Son   . Muscular dystrophy Cousin   . Muscular dystrophy Cousin   . Muscular dystrophy Cousin     Social History   Socioeconomic History  . Marital status: Widowed    Spouse name: Not on file  . Number of children: Not on file  . Years of education: Not on file  . Highest education level: Not on file  Occupational  History  . Not on file  Tobacco Use  . Smoking status: Former Smoker    Packs/day: 0.50    Years: 40.00    Pack years: 20.00    Types: Cigarettes    Quit date: 10/30/2015    Years since quitting: 4.5  . Smokeless tobacco: Never Used  Vaping Use  . Vaping Use: Every day  Substance and Sexual Activity  . Alcohol use: No  . Drug use: No  . Sexual activity: Not Currently    Partners: Male  Other Topics Concern  . Not on file  Social History Narrative  . Not on file   Social Determinants of Health   Financial Resource Strain:   . Difficulty of Paying Living Expenses: Not on file  Food Insecurity:   . Worried About Charity fundraiser in the Last Year: Not on file  . Ran Out of Food in the Last Year: Not on file  Transportation Needs:   . Lack of Transportation (Medical): Not on file  . Lack of Transportation (Non-Medical): Not on file  Physical Activity:   . Days of Exercise per Week: Not on file  . Minutes of Exercise per Session: Not on file  Stress:   . Feeling of Stress : Not  on file  Social Connections:   . Frequency of Communication with Friends and Family: Not on file  . Frequency of Social Gatherings with Friends and Family: Not on file  . Attends Religious Services: Not on file  . Active Member of Clubs or Organizations: Not on file  . Attends Archivist Meetings: Not on file  . Marital Status: Not on file  Intimate Partner Violence:   . Fear of Current or Ex-Partner: Not on file  . Emotionally Abused: Not on file  . Physically Abused: Not on file  . Sexually Abused: Not on file    Outpatient Medications Prior to Visit  Medication Sig Dispense Refill  . aspirin 81 MG tablet Take 81 mg by mouth daily.    . furosemide (LASIX) 80 MG tablet Take 40 mg by mouth daily as needed.    . hydrALAZINE (APRESOLINE) 100 MG tablet     . lisinopril-hydrochlorothiazide (PRINZIDE,ZESTORETIC) 20-12.5 MG per tablet Take 1 tablet by mouth 2 (two) times daily.     .  repaglinide (PRANDIN) 0.5 MG tablet TAKE 1 TABLET BY MOUTH 3 TIMES DAILY BEFORE MEALS 12 tablet 0  . sodium bicarbonate 650 MG tablet     . atorvastatin (LIPITOR) 20 MG tablet TAKE 1 TABLET BY MOUTH EVERYDAY AT BEDTIME 90 tablet 3  . hydrOXYzine (ATARAX/VISTARIL) 10 MG tablet Take 1 tablet (10 mg total) by mouth 2 (two) times daily. 180 tablet 1  . pramipexole (MIRAPEX) 0.5 MG tablet TAKE 1 TABLET BY MOUTH EVERYDAY AT BEDTIME 90 tablet 0  . sertraline (ZOLOFT) 100 MG tablet TAKE 1 TABLET BY MOUTH EVERYDAY AT BEDTIME 90 tablet 1   No facility-administered medications prior to visit.    Allergies  Allergen Reactions  . Sulfa Antibiotics Swelling    Face/mouth  . Codeine Hives    ROS Review of Systems  Constitutional: Negative.   HENT: Negative.   Eyes: Negative.   Respiratory: Negative.   Cardiovascular: Negative.   Gastrointestinal: Negative.   Genitourinary: Negative.   Musculoskeletal: Negative.   Skin: Negative.   Neurological: Negative.   Psychiatric/Behavioral: Negative.       Objective:    Physical Exam Vitals and nursing note reviewed.  Constitutional:      General: She is not in acute distress.    Appearance: Normal appearance. She is normal weight. She is not ill-appearing, toxic-appearing or diaphoretic.  Cardiovascular:     Rate and Rhythm: Normal rate and regular rhythm.     Heart sounds: Normal heart sounds. No murmur heard.  No friction rub. No gallop.   Pulmonary:     Effort: Pulmonary effort is normal. No respiratory distress.     Breath sounds: Normal breath sounds. No stridor. No wheezing, rhonchi or rales.  Chest:     Chest wall: No tenderness.  Skin:    General: Skin is warm and dry.  Neurological:     General: No focal deficit present.     Mental Status: She is alert and oriented to person, place, and time. Mental status is at baseline.  Psychiatric:        Mood and Affect: Mood normal.        Behavior: Behavior normal.        Thought  Content: Thought content normal.        Judgment: Judgment normal.     BP (!) 189/93   Pulse 82   Temp 98.2 F (36.8 C) (Temporal)   Resp 18   Ht 5'  5" (1.651 m)   Wt 203 lb 3.2 oz (92.2 kg)   SpO2 100%   BMI 33.81 kg/m  Wt Readings from Last 3 Encounters:  05/04/20 203 lb 3.2 oz (92.2 kg)  05/29/19 197 lb (89.4 kg)  02/27/19 197 lb (89.4 kg)     Health Maintenance Due  Topic Date Due  . Hepatitis C Screening  Never done  . OPHTHALMOLOGY EXAM  Never done  . DEXA SCAN  01/25/2018  . HEMOGLOBIN A1C  08/30/2019    There are no preventive care reminders to display for this patient.  No results found for: TSH Lab Results  Component Value Date   WBC 6.8 02/27/2019   HGB 9.7 (L) 02/27/2019   HCT 31.1 (L) 02/27/2019   MCV 87 02/27/2019   PLT 239 02/27/2019   Lab Results  Component Value Date   NA 135 02/27/2019   K 4.7 02/27/2019   CO2 20 02/27/2019   GLUCOSE 241 (H) 02/27/2019   BUN 38 (H) 02/27/2019   CREATININE 2.68 (H) 02/27/2019   BILITOT <0.2 02/27/2019   ALKPHOS 54 02/27/2019   AST 20 02/27/2019   ALT 21 02/27/2019   PROT 6.3 02/27/2019   ALBUMIN 3.8 02/27/2019   CALCIUM 8.5 (L) 02/27/2019   ANIONGAP 10 01/23/2018   Lab Results  Component Value Date   CHOL 165 02/27/2019   Lab Results  Component Value Date   HDL 37 (L) 02/27/2019   Lab Results  Component Value Date   LDLCALC 88 02/27/2019   Lab Results  Component Value Date   TRIG 202 (H) 02/27/2019   Lab Results  Component Value Date   CHOLHDL 4.5 (H) 02/27/2019   Lab Results  Component Value Date   HGBA1C 10.3 (H) 02/27/2019      Assessment & Plan:   Problem List Items Addressed This Visit      Endocrine   T2DM (type 2 diabetes mellitus) ([place])   Relevant Medications   atorvastatin (LIPITOR) 20 MG tablet   Other Relevant Orders   Ambulatory referral to Ophthalmology   Hemoglobin A1c   Comprehensive metabolic panel     Genitourinary   Chronic kidney disease (CKD), stage  IV (severe) (H[facility])   Relevant Orders   Hemoglobin A1c   Comprehensive metabolic panel   CBC with Differential     Other   Generalized anxiety disorder   Relevant Medications   sertraline (ZOLOFT) 100 MG tablet   hydrOXYzine (ATARAX/VISTARIL) 10 MG tablet    Other Visit Diagnoses    Routine general medical examination at a health care facility    -  Primary   Lipid screening       Relevant Orders   Lipid panel   Screening for endocrine disorder       Relevant Orders   TSH   Restless leg syndrome       Relevant Medications   pramipexole (MIRAPEX) 0.5 MG tablet   Hyperlipidemia, unspecified hyperlipidemia type       Relevant Medications   atorvastatin (LIPITOR) 20 MG tablet      Meds ordered this encounter  Medications  . sertraline (ZOLOFT) 100 MG tablet    Sig: TAKE 1 TABLET BY MOUTH EVERYDAY AT BEDTIME    Dispense:  90 tablet    Refill:  1    Order Specific Question:   Supervising Provider    Answer:   Carlota Raspberry, JEFFREY R [[id number]]  . pramipexole (MIRAPEX) 0.5 MG tablet    Sig: TAKE 1  TABLET BY MOUTH EVERYDAY AT BEDTIME    Dispense:  90 tablet    Refill:  0    Order Specific Question:   Supervising Provider    Answer:   Carlota Raspberry, JEFFREY R [[id number]]  . hydrOXYzine (ATARAX/VISTARIL) 10 MG tablet    Sig: Take 1 tablet (10 mg total) by mouth 2 (two) times daily.    Dispense:  180 tablet    Refill:  1    Order Specific Question:   Supervising Provider    Answer:   Carlota Raspberry, JEFFREY R [[id number]]  . atorvastatin (LIPITOR) 20 MG tablet    Sig: TAKE 1 TABLET BY MOUTH EVERYDAY AT BEDTIME    Dispense:  90 tablet    Refill:  3    Order Specific Question:   Supervising Provider    Answer:   Carlota Raspberry, JEFFREY R [[id number]]    Follow-up: No follow-ups on file.   PLAN  Refill meds  Labs collected  Return to clinic in 3-6 mos  Refer to palliative care  Patient encouraged to call clinic with any questions, comments, or concerns.  Maximiano Coss, NP

## 2020-05-04 NOTE — Patient Instructions (Signed)
° ° ° °  If you have lab work done today you will be contacted with your lab results within the next 2 weeks.  If you have not heard from us then please contact us. The fastest way to get your results is to register for My Chart. ° ° °IF you received an x-ray today, you will receive an invoice from [facility]. Please contact [facility] at [phone] with questions or concerns regarding your invoice.  ° °IF you received labwork today, you will receive an invoice from [facility]. Please contact [facility] at [phone] with questions or concerns regarding your invoice.  ° °Our billing staff will not be able to assist you with questions regarding bills from these companies. ° °You will be contacted with the lab results as soon as they are available. The fastest way to get your results is to activate your My Chart account. Instructions are located on the last page of this paperwork. If you have not heard from us regarding the results in 2 weeks, please contact this office. °  ° ° ° °

## 2020-05-05 ENCOUNTER — Telehealth: Payer: Self-pay

## 2020-05-05 LAB — COMPREHENSIVE METABOLIC PANEL
ALT: 20 IU/L (ref 0–32)
AST: 22 IU/L (ref 0–40)
Albumin/Globulin Ratio: 1.4 (ref 1.2–2.2)
Albumin: 3.7 g/dL — ABNORMAL LOW (ref 3.8–4.8)
Alkaline Phosphatase: 46 IU/L (ref 44–121)
BUN/Creatinine Ratio: 10 — ABNORMAL LOW (ref 12–28)
BUN: 61 mg/dL — ABNORMAL HIGH (ref 8–27)
Bilirubin Total: <0.2 mg/dL (ref 0.0–1.2)
CO2: 18 mmol/L — ABNORMAL LOW (ref 20–29)
Calcium: 8.9 mg/dL (ref 8.7–10.3)
Chloride: 103 mmol/L (ref 96–106)
Creatinine, Ser: 6 mg/dL (ref 0.57–1.00)
GFR calc Af Amer: 8 mL/min/{1.73_m2} — ABNORMAL LOW (ref >59)
GFR calc non Af Amer: 7 mL/min/{1.73_m2} — ABNORMAL LOW (ref >59)
Globulin, Total: 2.7 g/dL (ref 1.5–4.5)
Glucose: 156 mg/dL — ABNORMAL HIGH (ref 65–99)
Potassium: 5.2 mmol/L (ref 3.5–5.2)
Sodium: 140 mmol/L (ref 134–144)
Total Protein: 6.4 g/dL (ref 6.0–8.5)

## 2020-05-05 LAB — CBC WITH DIFFERENTIAL/PLATELET
Basophils Absolute: 0.1 10*3/uL (ref 0.0–0.2)
Basos: 1 %
EOS (ABSOLUTE): 0.2 10*3/uL (ref 0.0–0.4)
Eos: 2 %
Hematocrit: 27.1 % — ABNORMAL LOW (ref 34.0–46.6)
Hemoglobin: 8.4 g/dL — ABNORMAL LOW (ref 11.1–15.9)
Immature Grans (Abs): 0 10*3/uL (ref 0.0–0.1)
Immature Granulocytes: 1 %
Lymphocytes Absolute: 1.6 10*3/uL (ref 0.7–3.1)
Lymphs: 21 %
MCH: 26.3 pg — ABNORMAL LOW (ref 26.6–33.0)
MCHC: 31 g/dL — ABNORMAL LOW (ref 31.5–35.7)
MCV: 85 fL (ref 79–97)
Monocytes Absolute: 0.5 10*3/uL (ref 0.1–0.9)
Monocytes: 7 %
Neutrophils Absolute: 5.1 10*3/uL (ref 1.4–7.0)
Neutrophils: 68 %
Platelets: 258 10*3/uL (ref 150–450)
RBC: 3.19 x10E6/uL — ABNORMAL LOW (ref 3.77–5.28)
RDW: 15.3 % (ref 11.7–15.4)
WBC: 7.5 10*3/uL (ref 3.4–10.8)

## 2020-05-05 LAB — LIPID PANEL
Chol/HDL Ratio: 4.7 ratio — ABNORMAL HIGH (ref 0.0–4.4)
Cholesterol, Total: 191 mg/dL (ref 100–199)
HDL: 41 mg/dL (ref >39)
LDL Chol Calc (NIH): 123 mg/dL — ABNORMAL HIGH (ref 0–99)
Triglycerides: 149 mg/dL (ref 0–149)
VLDL Cholesterol Cal: 27 mg/dL (ref 5–40)

## 2020-05-05 LAB — HEMOGLOBIN A1C
Est. average glucose Bld gHb Est-mCnc: 209 mg/dL
Hgb A1c MFr Bld: 8.9 % — ABNORMAL HIGH (ref 4.8–5.6)

## 2020-05-05 LAB — TSH: TSH: 3.24 u[IU]/mL (ref 0.450–4.500)

## 2020-05-05 NOTE — Telephone Encounter (Signed)
(  4:54p) Telephone call to patient to schedule palliative care visit with patient. SW left a message and requested a call back to schedule an initial palliative care visit.

## 2020-05-23 ENCOUNTER — Telehealth: Payer: Self-pay

## 2020-05-23 NOTE — Telephone Encounter (Signed)
Telephone call to patient to schedule palliative care visit with patient. SW received a voice mail and left and message requesting a call back to schedule a palliative care visit.

## 2020-05-25 ENCOUNTER — Encounter: Payer: Self-pay | Admitting: *Deleted

## 2020-06-22 ENCOUNTER — Telehealth: Payer: Self-pay

## 2020-06-22 NOTE — Telephone Encounter (Signed)
(  10:44am) SW attempted to contact patient to schedule initial visit with her. SW received her voicemail and left a message. SW also left a message with her sister.

## 2020-07-04 ENCOUNTER — Telehealth: Payer: Self-pay | Admitting: *Deleted

## 2020-07-04 NOTE — Telephone Encounter (Signed)
Received a Palliative care referral on this patient from Dr. Maximiano Coss on 05/05/20. We have called and left messages with patient several times and have not received an answer or return call. Will now discharge patient from our referral list but will be happy to serve her in the future.

## 2020-07-25 ENCOUNTER — Other Ambulatory Visit: Payer: Self-pay | Admitting: Registered Nurse

## 2020-07-25 DIAGNOSIS — G2581 Restless legs syndrome: Secondary | ICD-10-CM

## 2020-08-09 ENCOUNTER — Other Ambulatory Visit: Payer: Self-pay | Admitting: Registered Nurse

## 2020-08-09 DIAGNOSIS — N184 Chronic kidney disease, stage 4 (severe): Secondary | ICD-10-CM

## 2020-08-09 DIAGNOSIS — E1122 Type 2 diabetes mellitus with diabetic chronic kidney disease: Secondary | ICD-10-CM

## 2020-10-05 ENCOUNTER — Other Ambulatory Visit: Payer: Self-pay | Admitting: Registered Nurse

## 2020-10-05 DIAGNOSIS — G2581 Restless legs syndrome: Secondary | ICD-10-CM

## 2020-10-24 ENCOUNTER — Other Ambulatory Visit: Payer: Self-pay | Admitting: Registered Nurse

## 2020-10-24 DIAGNOSIS — F411 Generalized anxiety disorder: Secondary | ICD-10-CM

## 2020-11-15 ENCOUNTER — Other Ambulatory Visit: Payer: Self-pay | Admitting: Registered Nurse

## 2020-11-15 DIAGNOSIS — N184 Chronic kidney disease, stage 4 (severe): Secondary | ICD-10-CM

## 2020-11-15 DIAGNOSIS — E1122 Type 2 diabetes mellitus with diabetic chronic kidney disease: Secondary | ICD-10-CM

## 2020-12-16 ENCOUNTER — Inpatient Hospital Stay (HOSPITAL_COMMUNITY)
Admission: EM | Admit: 2020-12-16 | Discharge: 2020-12-20 | DRG: 951 | Disposition: A | Discharge status: Hospice | Payer: Medicare Other | Attending: Family Medicine | Admitting: Family Medicine

## 2020-12-16 ENCOUNTER — Emergency Department (HOSPITAL_COMMUNITY): Payer: Medicare Other

## 2020-12-16 DIAGNOSIS — E8770 Fluid overload, unspecified: Secondary | ICD-10-CM | POA: Diagnosis present

## 2020-12-16 DIAGNOSIS — Z79899 Other long term (current) drug therapy: Secondary | ICD-10-CM

## 2020-12-16 DIAGNOSIS — Z66 Do not resuscitate: Secondary | ICD-10-CM | POA: Diagnosis not present

## 2020-12-16 DIAGNOSIS — F419 Anxiety disorder, unspecified: Secondary | ICD-10-CM | POA: Diagnosis present

## 2020-12-16 DIAGNOSIS — R1900 Intra-abdominal and pelvic swelling, mass and lump, unspecified site: Secondary | ICD-10-CM | POA: Diagnosis not present

## 2020-12-16 DIAGNOSIS — R0602 Shortness of breath: Secondary | ICD-10-CM | POA: Diagnosis not present

## 2020-12-16 DIAGNOSIS — Z820 Family history of epilepsy and other diseases of the nervous system: Secondary | ICD-10-CM

## 2020-12-16 DIAGNOSIS — Z882 Allergy status to sulfonamides status: Secondary | ICD-10-CM

## 2020-12-16 DIAGNOSIS — F32A Depression, unspecified: Secondary | ICD-10-CM | POA: Diagnosis present

## 2020-12-16 DIAGNOSIS — Z8249 Family history of ischemic heart disease and other diseases of the circulatory system: Secondary | ICD-10-CM

## 2020-12-16 DIAGNOSIS — Z992 Dependence on renal dialysis: Secondary | ICD-10-CM

## 2020-12-16 DIAGNOSIS — I120 Hypertensive chronic kidney disease with stage 5 chronic kidney disease or end stage renal disease: Secondary | ICD-10-CM | POA: Diagnosis not present

## 2020-12-16 DIAGNOSIS — N185 Chronic kidney disease, stage 5: Secondary | ICD-10-CM | POA: Diagnosis present

## 2020-12-16 DIAGNOSIS — Z515 Encounter for palliative care: Principal | ICD-10-CM

## 2020-12-16 DIAGNOSIS — I10 Essential (primary) hypertension: Secondary | ICD-10-CM | POA: Diagnosis not present

## 2020-12-16 DIAGNOSIS — E119 Type 2 diabetes mellitus without complications: Secondary | ICD-10-CM

## 2020-12-16 DIAGNOSIS — E875 Hyperkalemia: Secondary | ICD-10-CM | POA: Diagnosis present

## 2020-12-16 DIAGNOSIS — E1122 Type 2 diabetes mellitus with diabetic chronic kidney disease: Secondary | ICD-10-CM | POA: Diagnosis not present

## 2020-12-16 DIAGNOSIS — R609 Edema, unspecified: Secondary | ICD-10-CM | POA: Diagnosis not present

## 2020-12-16 DIAGNOSIS — Z885 Allergy status to narcotic agent status: Secondary | ICD-10-CM

## 2020-12-16 DIAGNOSIS — R54 Age-related physical debility: Secondary | ICD-10-CM | POA: Diagnosis present

## 2020-12-16 DIAGNOSIS — Z8673 Personal history of transient ischemic attack (TIA), and cerebral infarction without residual deficits: Secondary | ICD-10-CM

## 2020-12-16 DIAGNOSIS — Z7982 Long term (current) use of aspirin: Secondary | ICD-10-CM

## 2020-12-16 DIAGNOSIS — Z20822 Contact with and (suspected) exposure to covid-19: Secondary | ICD-10-CM | POA: Diagnosis not present

## 2020-12-16 DIAGNOSIS — N186 End stage renal disease: Secondary | ICD-10-CM | POA: Diagnosis present

## 2020-12-16 DIAGNOSIS — Z87891 Personal history of nicotine dependence: Secondary | ICD-10-CM

## 2020-12-16 DIAGNOSIS — R0603 Acute respiratory distress: Secondary | ICD-10-CM | POA: Diagnosis present

## 2020-12-16 DIAGNOSIS — D631 Anemia in chronic kidney disease: Secondary | ICD-10-CM | POA: Diagnosis present

## 2020-12-16 DIAGNOSIS — Z801 Family history of malignant neoplasm of trachea, bronchus and lung: Secondary | ICD-10-CM

## 2020-12-16 DIAGNOSIS — K219 Gastro-esophageal reflux disease without esophagitis: Secondary | ICD-10-CM | POA: Diagnosis present

## 2020-12-16 DIAGNOSIS — R601 Generalized edema: Secondary | ICD-10-CM | POA: Diagnosis present

## 2020-12-16 LAB — CBC WITH DIFFERENTIAL/PLATELET
Abs Immature Granulocytes: 0.09 10*3/uL — ABNORMAL HIGH (ref 0.00–0.07)
Basophils Absolute: 0.1 10*3/uL (ref 0.0–0.1)
Basophils Relative: 1 %
Eosinophils Absolute: 0.2 10*3/uL (ref 0.0–0.5)
Eosinophils Relative: 2 %
HCT: 22.9 % — ABNORMAL LOW (ref 36.0–46.0)
Hemoglobin: 6.5 g/dL — CL (ref 12.0–15.0)
Immature Granulocytes: 1 %
Lymphocytes Relative: 5 %
Lymphs Abs: 0.5 10*3/uL — ABNORMAL LOW (ref 0.7–4.0)
MCH: 24.3 pg — ABNORMAL LOW (ref 26.0–34.0)
MCHC: 28.4 g/dL — ABNORMAL LOW (ref 30.0–36.0)
MCV: 85.4 fL (ref 80.0–100.0)
Monocytes Absolute: 0.5 10*3/uL (ref 0.1–1.0)
Monocytes Relative: 5 %
Neutro Abs: 8 10*3/uL — ABNORMAL HIGH (ref 1.7–7.7)
Neutrophils Relative %: 86 %
Platelets: 257 10*3/uL (ref 150–400)
RBC: 2.68 MIL/uL — ABNORMAL LOW (ref 3.87–5.11)
RDW: 21.2 % — ABNORMAL HIGH (ref 11.5–15.5)
WBC: 9.3 10*3/uL (ref 4.0–10.5)
nRBC: 0 % (ref 0.0–0.2)

## 2020-12-16 MED ORDER — MORPHINE SULFATE (PF) 4 MG/ML IV SOLN
6.0000 mg | Freq: Once | INTRAVENOUS | Status: AC
  Administered 2020-12-16: 6 mg via INTRAVENOUS
  Filled 2020-12-16: qty 2

## 2020-12-16 NOTE — ED Provider Notes (Signed)
[facility] EMERGENCY DEPARTMENT Provider Note   CSN: [id number] Arrival date & time: 12/16/20  2214     History Chief Complaint  Patient presents with  . Shortness of Breath    Amy Herrera is a 70 y.o. female.  HPI  Patient presents with respiratory distress.  She is breathing very heavily and says that she feels like she cannot breathe.  She is tachypneic.  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  She has been previously evaluated for transplant but has not been transplanted.  At this time, she wishes to be made comfortable and she does not want any disease oriented therapy in an effort to prolong her life.  She comes in because of her severe discomfort at home.  Her sister is at bedside and is aware of these wishes and although she disagrees she honored the patient's wishes.  Both the patient's sister and the patient independently tell me that the patient does not have depression, she is not suicidal, she is not confused.     Past Medical History:  Diagnosis Date  . Anxiety   . Asthma   . Depression   . GERD (gastroesophageal reflux disease)   . Hypertension   . Obesity   . Tobacco abuse   . Type II or unspecified type diabetes mellitus without mention of complication, not stated as uncontrolled     Patient Active Problem List   Diagnosis Date Noted  . T2DM (type 2 diabetes mellitus) ([place]) 02/06/2019  . Generalized anxiety disorder 02/06/2019  . Chronic kidney disease (CKD), stage IV (severe) ([place]) 06/06/2018    Past Surgical History:  Procedure Laterality Date  . TONSILLECTOMY    . TUBAL LIGATION       OB History    Gravida  1   Para  1   Term  1   Preterm      AB      Living        SAB      IAB      Ectopic       Multiple      Live Births              Family History  Problem Relation Age of Onset  . Cancer Mother        lung  . Heart attack Father   . Heart disease Father   . Heart disease Sister   . Hypertension Sister   . Muscular dystrophy Son   . Muscular dystrophy Cousin   . Muscular dystrophy Cousin   . Muscular dystrophy Cousin     Social History   Tobacco Use  . Smoking status: Former Smoker    Packs/day: 0.50    Years: 40.00    Pack years: 20.00    Types: Cigarettes    Quit date: 10/30/2015    Years since quitting: 5.1  . Smokeless tobacco: Never Used  Vaping Use  . Vaping Use: Every day  Substance Use Topics  . Alcohol use: No  . Drug use: No    Home Medications Prior to Admission medications   Medication Sig Start Date End Date Taking? Authorizing Provider  aspirin 81 MG tablet Take 81 mg by mouth daily.  Yes [provider]  atorvastatin (LIPITOR) 20 MG tablet TAKE 1 TABLET BY MOUTH EVERYDAY AT BEDTIME Patient taking differently: Take 20 mg by mouth at bedtime. 05/04/20  Yes Maximiano Coss, NP  carvedilol (COREG) 25 MG tablet Take 25 mg by mouth 2 (two) times daily with a meal.   Yes [provider]  furosemide (LASIX) 80 MG tablet Take 40 mg by mouth daily.   Yes [provider]  hydrALAZINE (APRESOLINE) 100 MG tablet Take 100 mg by mouth 3 (three) times daily. 03/25/17  Yes [provider]  pramipexole (MIRAPEX) 0.5 MG tablet TAKE 1 TABLET BY MOUTH EVERYDAY AT BEDTIME Patient taking differently: Take 0.5 mg by mouth at bedtime. 10/05/20  Yes Maximiano Coss, NP  repaglinide (PRANDIN) 0.5 MG tablet TAKE 1 TABLET BY MOUTH 3 TIMES DAILY BEFORE MEALS Patient taking differently: Take 0.5 mg by mouth 3 (three) times daily before meals. 11/15/20  Yes Maximiano Coss, NP  sertraline (ZOLOFT) 100 MG tablet TAKE 1 TABLET BY MOUTH EVERYDAY AT BEDTIME Patient taking differently: Take 100 mg by mouth at bedtime. 10/25/20  Yes Maximiano Coss, NP  sodium bicarbonate 650 MG tablet Take 1,300 mg by mouth 2 (two) times daily. 09/23/17  Yes [provider]  hydrOXYzine (ATARAX/VISTARIL) 10 MG tablet Take 1 tablet (10 mg total) by mouth 2 (two) times daily. Patient not taking: No sig reported 05/04/20   Maximiano Coss, NP    Allergies    Sulfa antibiotics and Codeine  Review of Systems   Review of Systems  Respiratory: Positive for shortness of breath.   Musculoskeletal:       Leg pain and swelling    Physical Exam Updated Vital Signs BP (!) 160/86 (BP Location: Right Arm)   Pulse 70   Temp 97.7 F (36.5 C) (Oral)   Resp 20   Ht '5\' 5"'$  (1.651 m)   Wt 90.7 kg   SpO2 99%   BMI 33.28 kg/m   Physical Exam Vitals and nursing note reviewed.  Constitutional:      General: She is in acute distress.     Appearance: She is well-developed.  HENT:     Head: Normocephalic and atraumatic.  Eyes:     Extraocular Movements: Extraocular movements intact.     Conjunctiva/sclera: Conjunctivae normal.     Pupils: Pupils are equal, round, and reactive to light.  Cardiovascular:     Rate and Rhythm: Normal rate.     Heart sounds: No murmur heard.   Pulmonary:     Effort: Tachypnea present.  Abdominal:     Palpations: Abdomen is soft.     Tenderness: There is no abdominal tenderness.  Musculoskeletal:     Cervical back: Normal range of motion and neck supple.     Right lower leg: Edema present.     Left lower leg: Edema present.  Skin:    Comments: Skin breakdown in bilateral distal lower extremities No open wounds  Neurological:     General: No focal deficit present.     Mental Status: She is alert.  Psychiatric:        Behavior: Behavior normal. Behavior is not agitated.     ED Results / Procedures / Treatments   Labs (all labs ordered are listed, but only abnormal results are displayed) Labs Reviewed  CBC WITH DIFFERENTIAL/PLATELET - Abnormal; Notable for the following components:      Result  Value   RBC 2.68 (*)    Hemoglobin 6.5 (*)  HCT 22.9 (*)    MCH 24.3 (*)    MCHC 28.4 (*)    RDW 21.2 (*)    Neutro Abs 8.0 (*)    Lymphs Abs 0.5 (*)    Abs Immature Granulocytes 0.09 (*)    All other components within normal limits  BASIC METABOLIC PANEL - Abnormal; Notable for the following components:   Potassium 5.7 (*)    Chloride 113 (*)    CO2 14 (*)    Glucose, Bld 147 (*)    BUN 81 (*)    Creatinine, Ser 7.56 (*)    Calcium 5.8 (*)    GFR, Estimated 5 (*)    All other components within normal limits  RESP PANEL BY RT-PCR (FLU A&B, COVID) [id number]    EKG None  Radiology DG Chest 1 View  Result Date: 12/16/2020 CLINICAL DATA:  Increasing shortness of breath for several days, swelling EXAM: CHEST  1 VIEW COMPARISON:  01/23/2018 FINDINGS: Single frontal view of the chest demonstrates borderline enlargement the cardiac silhouette. There is increased central vascular congestion without focal airspace disease, effusion, or pneumothorax. No acute bony abnormalities. IMPRESSION: 1. Increased central vascular congestion without overt edema. Electronically Signed   By: Randa Ngo M.D.   On: 12/16/2020 23:20    Procedures Procedures   Medications Ordered in ED Medications  morphine 4 MG/ML injection 4 mg (has no administration in time range)  morphine 4 MG/ML injection 6 mg (6 mg Intravenous Given 12/16/20 2249)    ED Course  I have reviewed the triage vital signs and the nursing notes.  Pertinent labs & imaging results that were available during my care of the patient were reviewed by me and considered in my medical decision making (see chart for details).    MDM Rules/Calculators/A&P                           Patient presents with significant difficulty breathing.  Fluid overloaded on exam.  She does not want disease run in therapy but wants to be made comfortable.  She has voiced clear comfort care goals and is decisional.  I see that she has not followed with  palliative care on chart review.  Given her degree of discomfort I do not think I will be able to control her symptoms in the emergency department sufficiently to go home and continue to be comfortable at home.  She would be open, after my discussion, to receiving IV Lasix if it would help her breathe better, and she therefore agrees to getting a chest x-ray to see if Lasix would help her breathe better.  Otherwise, there is no indication for emergent work-up.  I did discuss with her that she could be having trouble breathing from an alternate pathology such as heart attack or blood clot to her lungs but she does not want a be tested for this either.  6 mg of morphine was ordered.   CXR pulmonary vascular congestion. Borderline cardiomegaly. No focal consolidation.  Given her gross fluid overload on exam and orthopnea and dyspnea on exertion and gradual onset of her shortness of breath I still think her history and physical are consistent with fluid overload etiology of her shortness of breath and will treat accordingly. On re-evaluation, pt improved, still having symptoms and needing more morphine. Labs showed electrolyte derangement as evidence of end-stage renal dysfunction. Plan to admit to hospitalist for further symptom control and palliative care planning  Final Clinical Impression(s) / ED Diagnoses Final diagnoses:  None    Rx / DC Orders ED Discharge Orders    None       Aris Lot, MD 12/17/20 0104    Rex Kras, Wenda Overland, MD 12/18/20 1454

## 2020-12-16 NOTE — ED Triage Notes (Signed)
Patient to ED via EMS for complaints of SOB and abd swelling. States she has ESRD, refuses dialysis. States she has been having increasing SOB over last few days, at this point she feels as if she "can't get enough air". States she also has noticed seeping from her arms and legs.

## 2020-12-17 ENCOUNTER — Encounter (HOSPITAL_COMMUNITY): Payer: Self-pay | Admitting: Internal Medicine

## 2020-12-17 DIAGNOSIS — N186 End stage renal disease: Secondary | ICD-10-CM | POA: Diagnosis present

## 2020-12-17 DIAGNOSIS — Z20822 Contact with and (suspected) exposure to covid-19: Secondary | ICD-10-CM | POA: Diagnosis present

## 2020-12-17 DIAGNOSIS — R531 Weakness: Secondary | ICD-10-CM | POA: Diagnosis not present

## 2020-12-17 DIAGNOSIS — Z820 Family history of epilepsy and other diseases of the nervous system: Secondary | ICD-10-CM | POA: Diagnosis not present

## 2020-12-17 DIAGNOSIS — Z515 Encounter for palliative care: Secondary | ICD-10-CM | POA: Diagnosis not present

## 2020-12-17 DIAGNOSIS — D631 Anemia in chronic kidney disease: Secondary | ICD-10-CM | POA: Diagnosis present

## 2020-12-17 DIAGNOSIS — E8809 Other disorders of plasma-protein metabolism, not elsewhere classified: Secondary | ICD-10-CM | POA: Diagnosis not present

## 2020-12-17 DIAGNOSIS — F32A Depression, unspecified: Secondary | ICD-10-CM | POA: Diagnosis present

## 2020-12-17 DIAGNOSIS — J9601 Acute respiratory failure with hypoxia: Secondary | ICD-10-CM | POA: Diagnosis not present

## 2020-12-17 DIAGNOSIS — Z8249 Family history of ischemic heart disease and other diseases of the circulatory system: Secondary | ICD-10-CM | POA: Diagnosis not present

## 2020-12-17 DIAGNOSIS — R0602 Shortness of breath: Secondary | ICD-10-CM | POA: Diagnosis not present

## 2020-12-17 DIAGNOSIS — K219 Gastro-esophageal reflux disease without esophagitis: Secondary | ICD-10-CM | POA: Diagnosis present

## 2020-12-17 DIAGNOSIS — I959 Hypotension, unspecified: Secondary | ICD-10-CM | POA: Diagnosis not present

## 2020-12-17 DIAGNOSIS — Z7982 Long term (current) use of aspirin: Secondary | ICD-10-CM | POA: Diagnosis not present

## 2020-12-17 DIAGNOSIS — Z882 Allergy status to sulfonamides status: Secondary | ICD-10-CM | POA: Diagnosis not present

## 2020-12-17 DIAGNOSIS — E8770 Fluid overload, unspecified: Secondary | ICD-10-CM | POA: Diagnosis present

## 2020-12-17 DIAGNOSIS — N185 Chronic kidney disease, stage 5: Secondary | ICD-10-CM | POA: Diagnosis not present

## 2020-12-17 DIAGNOSIS — R54 Age-related physical debility: Secondary | ICD-10-CM | POA: Diagnosis present

## 2020-12-17 DIAGNOSIS — R601 Generalized edema: Secondary | ICD-10-CM | POA: Diagnosis not present

## 2020-12-17 DIAGNOSIS — Z8673 Personal history of transient ischemic attack (TIA), and cerebral infarction without residual deficits: Secondary | ICD-10-CM | POA: Diagnosis not present

## 2020-12-17 DIAGNOSIS — T17920A Food in respiratory tract, part unspecified causing asphyxiation, initial encounter: Secondary | ICD-10-CM | POA: Diagnosis not present

## 2020-12-17 DIAGNOSIS — I69318 Other symptoms and signs involving cognitive functions following cerebral infarction: Secondary | ICD-10-CM | POA: Diagnosis not present

## 2020-12-17 DIAGNOSIS — E1122 Type 2 diabetes mellitus with diabetic chronic kidney disease: Secondary | ICD-10-CM | POA: Diagnosis present

## 2020-12-17 DIAGNOSIS — R0600 Dyspnea, unspecified: Secondary | ICD-10-CM | POA: Diagnosis not present

## 2020-12-17 DIAGNOSIS — Z992 Dependence on renal dialysis: Secondary | ICD-10-CM | POA: Diagnosis not present

## 2020-12-17 DIAGNOSIS — F419 Anxiety disorder, unspecified: Secondary | ICD-10-CM | POA: Diagnosis present

## 2020-12-17 DIAGNOSIS — Z79899 Other long term (current) drug therapy: Secondary | ICD-10-CM | POA: Diagnosis not present

## 2020-12-17 DIAGNOSIS — Z801 Family history of malignant neoplasm of trachea, bronchus and lung: Secondary | ICD-10-CM | POA: Diagnosis not present

## 2020-12-17 DIAGNOSIS — Z87891 Personal history of nicotine dependence: Secondary | ICD-10-CM | POA: Diagnosis not present

## 2020-12-17 DIAGNOSIS — E875 Hyperkalemia: Secondary | ICD-10-CM | POA: Diagnosis present

## 2020-12-17 DIAGNOSIS — Z885 Allergy status to narcotic agent status: Secondary | ICD-10-CM | POA: Diagnosis not present

## 2020-12-17 DIAGNOSIS — Z9981 Dependence on supplemental oxygen: Secondary | ICD-10-CM | POA: Diagnosis not present

## 2020-12-17 DIAGNOSIS — Z66 Do not resuscitate: Secondary | ICD-10-CM | POA: Diagnosis present

## 2020-12-17 DIAGNOSIS — R0603 Acute respiratory distress: Secondary | ICD-10-CM | POA: Diagnosis present

## 2020-12-17 DIAGNOSIS — I120 Hypertensive chronic kidney disease with stage 5 chronic kidney disease or end stage renal disease: Secondary | ICD-10-CM | POA: Diagnosis present

## 2020-12-17 LAB — COMPREHENSIVE METABOLIC PANEL
ALT: 25 U/L (ref 0–44)
AST: 22 U/L (ref 15–41)
Albumin: 2.4 g/dL — ABNORMAL LOW (ref 3.5–5.0)
Alkaline Phosphatase: 51 U/L (ref 38–126)
Anion gap: 10 (ref 5–15)
BUN: 83 mg/dL — ABNORMAL HIGH (ref 8–23)
CO2: 14 mmol/L — ABNORMAL LOW (ref 22–32)
Calcium: 6 mg/dL — CL (ref 8.9–10.3)
Chloride: 116 mmol/L — ABNORMAL HIGH (ref 98–111)
Creatinine, Ser: 7.89 mg/dL — ABNORMAL HIGH (ref 0.44–1.00)
GFR, Estimated: 5 mL/min — ABNORMAL LOW (ref >60)
Glucose, Bld: 148 mg/dL — ABNORMAL HIGH (ref 70–99)
Potassium: 6.3 mmol/L (ref 3.5–5.1)
Sodium: 140 mmol/L (ref 135–145)
Total Bilirubin: 0.5 mg/dL (ref 0.3–1.2)
Total Protein: 6.1 g/dL — ABNORMAL LOW (ref 6.5–8.1)

## 2020-12-17 LAB — BASIC METABOLIC PANEL
Anion gap: 12 (ref 5–15)
BUN: 81 mg/dL — ABNORMAL HIGH (ref 8–23)
CO2: 14 mmol/L — ABNORMAL LOW (ref 22–32)
Calcium: 5.8 mg/dL — CL (ref 8.9–10.3)
Chloride: 113 mmol/L — ABNORMAL HIGH (ref 98–111)
Creatinine, Ser: 7.56 mg/dL — ABNORMAL HIGH (ref 0.44–1.00)
GFR, Estimated: 5 mL/min — ABNORMAL LOW (ref >60)
Glucose, Bld: 147 mg/dL — ABNORMAL HIGH (ref 70–99)
Potassium: 5.7 mmol/L — ABNORMAL HIGH (ref 3.5–5.1)
Sodium: 139 mmol/L (ref 135–145)

## 2020-12-17 LAB — CBC
HCT: 24.9 % — ABNORMAL LOW (ref 36.0–46.0)
Hemoglobin: 7 g/dL — ABNORMAL LOW (ref 12.0–15.0)
MCH: 24.4 pg — ABNORMAL LOW (ref 26.0–34.0)
MCHC: 28.1 g/dL — ABNORMAL LOW (ref 30.0–36.0)
MCV: 86.8 fL (ref 80.0–100.0)
Platelets: 259 10*3/uL (ref 150–400)
RBC: 2.87 MIL/uL — ABNORMAL LOW (ref 3.87–5.11)
RDW: 21.3 % — ABNORMAL HIGH (ref 11.5–15.5)
WBC: 10.1 10*3/uL (ref 4.0–10.5)
nRBC: 0 % (ref 0.0–0.2)

## 2020-12-17 LAB — RESP PANEL BY RT-PCR (FLU A&B, COVID) ARPGX2
Influenza A by PCR: NEGATIVE
Influenza B by PCR: NEGATIVE
SARS Coronavirus 2 by RT PCR: NEGATIVE

## 2020-12-17 LAB — HIV ANTIBODY (ROUTINE TESTING W REFLEX): HIV Screen 4th Generation wRfx: NONREACTIVE

## 2020-12-17 MED ORDER — FUROSEMIDE 10 MG/ML IJ SOLN
80.0000 mg | Freq: Two times a day (BID) | INTRAMUSCULAR | Status: DC
  Administered 2020-12-17 – 2020-12-20 (×8): 80 mg via INTRAVENOUS
  Filled 2020-12-17 (×8): qty 8

## 2020-12-17 MED ORDER — HEPARIN SODIUM (PORCINE) 5000 UNIT/ML IJ SOLN: 5000.0000 [IU] | Freq: Three times a day (TID) | INTRAMUSCULAR | Status: DC

## 2020-12-17 MED ORDER — SODIUM BICARBONATE 650 MG PO TABS
1300.0000 mg | ORAL_TABLET | Freq: Two times a day (BID) | ORAL | Status: DC
  Administered 2020-12-17 – 2020-12-20 (×7): 1300 mg via ORAL
  Filled 2020-12-17 (×10): qty 2

## 2020-12-17 MED ORDER — HYDROMORPHONE HCL 1 MG/ML IJ SOLN: 0.5000 mg | INTRAMUSCULAR | Status: DC | PRN

## 2020-12-17 MED ORDER — ASPIRIN 81 MG PO CHEW: 81.0000 mg | CHEWABLE_TABLET | Freq: Every day | ORAL | Status: DC

## 2020-12-17 MED ORDER — INSULIN ASPART 100 UNIT/ML IJ SOLN: 0.0000 [IU] | Freq: Three times a day (TID) | INTRAMUSCULAR | Status: DC

## 2020-12-17 MED ORDER — LORAZEPAM 1 MG PO TABS: 1.0000 mg | ORAL_TABLET | ORAL | Status: DC | PRN

## 2020-12-17 MED ORDER — HYDRALAZINE HCL 25 MG PO TABS: 100.0000 mg | ORAL_TABLET | Freq: Three times a day (TID) | ORAL | Status: DC

## 2020-12-17 MED ORDER — LORAZEPAM 2 MG/ML IJ SOLN: 1.0000 mg | INTRAMUSCULAR | Status: DC | PRN

## 2020-12-17 MED ORDER — HALOPERIDOL 0.5 MG PO TABS
0.5000 mg | ORAL_TABLET | ORAL | Status: DC | PRN
  Filled 2020-12-17: qty 1

## 2020-12-17 MED ORDER — HALOPERIDOL LACTATE 5 MG/ML IJ SOLN: 0.5000 mg | INTRAMUSCULAR | Status: DC | PRN

## 2020-12-17 MED ORDER — HYDROMORPHONE HCL 1 MG/ML IJ SOLN
0.2500 mg | INTRAMUSCULAR | Status: DC | PRN
  Administered 2020-12-17: 0.25 mg via INTRAVENOUS
  Filled 2020-12-17: qty 1

## 2020-12-17 MED ORDER — ACETAMINOPHEN 325 MG PO TABS: 650.0000 mg | ORAL_TABLET | Freq: Four times a day (QID) | ORAL | Status: DC | PRN

## 2020-12-17 MED ORDER — CARVEDILOL 12.5 MG PO TABS: 25.0000 mg | ORAL_TABLET | Freq: Two times a day (BID) | ORAL | Status: DC

## 2020-12-17 MED ORDER — SODIUM BICARBONATE 8.4 % IV SOLN
50.0000 meq | Freq: Once | INTRAVENOUS | Status: AC
  Administered 2020-12-17: 50 meq via INTRAVENOUS
  Filled 2020-12-17: qty 50

## 2020-12-17 MED ORDER — MORPHINE SULFATE (PF) 4 MG/ML IV SOLN
4.0000 mg | Freq: Once | INTRAVENOUS | Status: AC
  Administered 2020-12-17: 4 mg via INTRAVENOUS
  Filled 2020-12-17: qty 1

## 2020-12-17 MED ORDER — ALBUTEROL SULFATE (2.5 MG/3ML) 0.083% IN NEBU: 2.5000 mg | INHALATION_SOLUTION | RESPIRATORY_TRACT | Status: DC | PRN

## 2020-12-17 MED ORDER — HALOPERIDOL LACTATE 2 MG/ML PO CONC
0.5000 mg | ORAL | Status: DC | PRN
  Filled 2020-12-17: qty 0.3

## 2020-12-17 MED ORDER — ATORVASTATIN CALCIUM 10 MG PO TABS: 20.0000 mg | ORAL_TABLET | Freq: Every day | ORAL | Status: DC

## 2020-12-17 MED ORDER — PRAMIPEXOLE DIHYDROCHLORIDE 0.25 MG PO TABS: 0.5000 mg | ORAL_TABLET | Freq: Every day | ORAL | Status: DC

## 2020-12-17 MED ORDER — ACETAMINOPHEN 650 MG RE SUPP: 650.0000 mg | Freq: Four times a day (QID) | RECTAL | Status: DC | PRN

## 2020-12-17 MED ORDER — LORAZEPAM 2 MG/ML PO CONC: 1.0000 mg | ORAL | Status: DC | PRN

## 2020-12-17 MED ORDER — OXYCODONE HCL 20 MG/ML PO CONC
5.0000 mg | ORAL | Status: DC | PRN
  Administered 2020-12-19: 5 mg via ORAL
  Filled 2020-12-17: qty 1

## 2020-12-17 MED ORDER — ONDANSETRON 4 MG PO TBDP: 4.0000 mg | ORAL_TABLET | Freq: Four times a day (QID) | ORAL | Status: DC | PRN

## 2020-12-17 MED ORDER — SERTRALINE HCL 100 MG PO TABS
100.0000 mg | ORAL_TABLET | Freq: Every day | ORAL | Status: DC
  Administered 2020-12-17 – 2020-12-20 (×4): 100 mg via ORAL
  Filled 2020-12-17 (×4): qty 1

## 2020-12-17 MED ORDER — SODIUM ZIRCONIUM CYCLOSILICATE 10 G PO PACK: 10.0000 g | PACK | Freq: Once | ORAL | Status: DC

## 2020-12-17 MED ORDER — ONDANSETRON HCL 4 MG/2ML IJ SOLN
4.0000 mg | Freq: Four times a day (QID) | INTRAMUSCULAR | Status: DC | PRN
Start: 2020-12-17 — End: 2020-12-21

## 2020-12-17 NOTE — ED Notes (Addendum)
Pt very difficult to arouse . Would not stay awake to answer questions. Alert to self and place only at this time.

## 2020-12-17 NOTE — Consult Note (Signed)
Consultation Note Date: 12/17/2020   Patient Name: Amy Herrera  DOB: 03-14-1951  MRN: [id number]  Age / Sex: 70 y.o., female  PCP: Amy Coss, NP Referring Physician: Barb Merino, MD  Reason for Consultation: end of life care, symptom management  HPI/Patient Profile: 70 y.o. female  with past medical history of chronic kidney disease stage V (baseline creatinine around 6), type 2 DM, anemia, and hypertension who presented to the emergency department on 12/16/2020 with worsening shortness of breath over the past week. Patient also reporting significant peripheral edema and abdominal distention.  ED Course: Labs show potassium 5.7, creatinine 7.56, and hemoglobin 6.5. Patient initially stated she does not want dialysis but is okay with medical treatment such as IV lasix and blood transfusions if required. She stated if she does not improve she would want to be transitioned to comfort measures. Lasix 80 mg IV every 12 hours ordered along with Lokelma for hyperkalemia.  Patient admitted to [facility] for further management of fluid overload, anasarca, and progressive renal failure.   Clinical Assessment and Goals of Care: I have reviewed medical records including EPIC notes, labs and imaging. Overnight, potassium increased to 6.3 and creatinine increased to 7.89.  Per progress note from Dr. Sloan Herrera this morning, patient is nearing end of life and came to the [facility] seeking hospice care. Per his discussion with sister, patient was transitioned to full comfort care.   I met with patient at bedside. She is lethargic but able to answer questions and participate in simple conversation. She reports pain in her IV - it is tender at the site - she winces in pain even with light touch. I have notified her bedside RN and requested that she let me know if patient does not have good IV access so that I can order pain medication  via an alternative route.   I confirmed with patient that she desires comfort measures only. Reviewed that comfort care means allowing a natural course to occur, with the goal of comfort and dignity rather than cure/prolonging life. Reviewed that she will not receive any unnecessary interventions such as labs, further diagnostic testing, cardiac monitoring, or artificial fluids or feeding. Patient verbalizes understanding.    16:30--Spoke with sister/Amy Herrera by phone. I introduced Palliative Medicine as specialized medical care for people living with serious illness. It focuses on providing relief from the symptoms and stress of a serious illness.  Amy Herrera shares that this situation has been difficult for her. Emotional support provided.   We discussed a brief life review of the patient. She is originally from the [place]. She had 1 son, unfortunately he passed away from muscular dystrophy at age 66. Her husband is also deceased. 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 Amy Herrera reports that her sister was adamant that she would never want dialysis.   I reviewed the concept of a comfort path with Amy Herrera, emphasizing that this path involves allowing a natural course to occur. Discussed that the goal is comfort and  dignity rather than cure/prolonging life. Amy Herrera verbalizes understanding. Offered the option to transition to residential Amy [facility] would prefer [facility] .   Questions and concerns were addressed. I provided Amy Herrera with PMT contact info and encouraged her to call with questions or concerns.   Primary decision maker: Patient, next of kin is her sister Amy Herrera)    SUMMARY OF RECOMMENDATIONS    Continue full comfort measures   DNR/DNI as previously documented  Transfer to [facility] when bed becomes available - hospice liaison notified  Dilaudid 0.5 mg every 2 hours PRN pain or dyspnea  Oxycodone concentrate solution 5 mg every 2  hours PRN pain or dyspnea (as alternative pain medication if patient loses IV access)  Provide frequent assessments and administer PRN medications as clinically necessary to ensure EOL comfort  PMT will continue to follow   Symptom Management:   Lorazepam (ATIVAN) prn for anxiety  Haloperidol (HALDOL) prn for agitation   Glycopyrrolate (ROBINUL) for excessive secretions  Ondansetron (ZOFRAN) prn for nausea  Polyvinyl alcohol (LIQUIFILM TEARS) prn for dry eyes  Antiseptic oral rinse (BIOTENE) prn for dry mouth   Palliative Prophylaxis:   Turn/reposition, mouth care, frequent pain assessment  Additional Recommendations (Limitations, Scope, Preferences):  Full comfort care, no hemodialysis  Psycho-social/Spiritual:   Desire for further Chaplaincy support: no  Prognosis:   days  Discharge Planning: residential hospice      Primary Diagnoses: Present on Admission: . Fluid overload . CKD (chronic kidney disease), stage V ([place]) . 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 Anasarca . End stage kidney disease ([place])   I have reviewed the medical record, interviewed the patient and family, and examined the patient. The following aspects are pertinent.  Past Medical History:  Diagnosis Date  . Anxiety   . Asthma   . Depression   . GERD (gastroesophageal reflux disease)   . Hypertension   . Obesity   . Tobacco abuse   . Type II or unspecified type diabetes mellitus without mention of complication, not stated as uncontrolled    Family History  Problem Relation Age of Onset  . Cancer Mother        lung  . Heart attack Father   . Heart disease Father   . Heart disease Sister   . Hypertension Sister   . Muscular dystrophy Son   . Muscular dystrophy Cousin   . Muscular dystrophy Cousin   . Muscular dystrophy Cousin    Scheduled Meds: . furosemide  80 mg Intravenous Q12H  . sertraline  100 mg Oral QHS  . sodium bicarbonate  1,300 mg Oral BID    Continuous Infusions: PRN Meds:.acetaminophen **OR** acetaminophen, albuterol, haloperidol **OR** haloperidol **OR** haloperidol lactate, HYDROmorphone (DILAUDID) injection, LORazepam **OR** LORazepam **OR** LORazepam, ondansetron **OR** ondansetron (ZOFRAN) IV Medications Prior to Admission:  Prior to Admission medications   Medication Sig Start Date End Date Taking? Authorizing Provider  aspirin 81 MG tablet Take 81 mg by mouth daily.   Yes [provider]  atorvastatin (LIPITOR) 20 MG tablet TAKE 1 TABLET BY MOUTH EVERYDAY AT BEDTIME Patient taking differently: Take 20 mg by mouth at bedtime. 05/04/20  Yes Amy Coss, NP  carvedilol (COREG) 25 MG tablet Take 25 mg by mouth 2 (two) times daily with a meal.   Yes [provider]  furosemide (LASIX) 80 MG tablet Take 40 mg by mouth daily.   Yes [provider]  hydrALAZINE (APRESOLINE) 100 MG tablet Take 100 mg  by mouth 3 (three) times daily. 03/25/17  Yes [provider]  pramipexole (MIRAPEX) 0.5 MG tablet TAKE 1 TABLET BY MOUTH EVERYDAY AT BEDTIME Patient taking differently: Take 0.5 mg by mouth at bedtime. 10/05/20  Yes Amy Coss, NP  repaglinide (PRANDIN) 0.5 MG tablet TAKE 1 TABLET BY MOUTH 3 TIMES DAILY BEFORE MEALS Patient taking differently: Take 0.5 mg by mouth 3 (three) times daily before meals. 11/15/20  Yes Amy Coss, NP  sertraline (ZOLOFT) 100 MG tablet TAKE 1 TABLET BY MOUTH EVERYDAY AT BEDTIME Patient taking differently: Take 100 mg by mouth at bedtime. 10/25/20  Yes Amy Coss, NP  sodium bicarbonate 650 MG tablet Take 1,300 mg by mouth 2 (two) times daily. 09/23/17  Yes [provider]  hydrOXYzine (ATARAX/VISTARIL) 10 MG tablet Take 1 tablet (10 mg total) by mouth 2 (two) times daily. Patient not taking: No sig reported 05/04/20   Amy Coss, NP   Allergies  Allergen Reactions  . Sulfa Antibiotics Swelling    Face/mouth  . Codeine Hives   Review of  Systems  Respiratory: Negative for shortness of breath.     Physical Exam Vitals reviewed.  Constitutional:      General: She is not in acute distress.    Appearance: She is ill-appearing.  Pulmonary:     Effort: Pulmonary effort is normal.  Neurological:     Mental Status: She is oriented to person, place, and time. She is lethargic.     Vital Signs: BP 127/64 (BP Location: Right Arm)   Pulse 71   Temp (!) 97.3 F (36.3 C) (Oral)   Resp 16   Ht _0  (1.651 m)   Wt 90.7 kg   SpO2 100%   BMI 33.28 kg/m  Pain Scale: 0-10   Pain Score: Asleep   SpO2: SpO2: 100 % O2 Device:SpO2: 100 % O2 Flow Rate: .O2 Flow Rate (L/min): 2 L/min  IO: Intake/output summary:   Intake/Output Summary (Last 24 hours) at 12/17/2020 1504 Last data filed at 12/17/2020 5643 Gross per 24 hour  Intake --  Output 200 ml  Net -200 ml    LBM:   Baseline Weight: Weight: 90.7 kg Most recent weight: Weight: 90.7 kg      Palliative Assessment/Data: PPS 20%     Time In: 1410 Time Out: 1504 Time Total: 54 minutes Greater than 50%  of this time was spent counseling and coordinating care related to the above assessment and plan.  Signed by: Lavena Bullion, NP   Please contact Palliative Medicine Team phone at [phone] for questions and concerns.  For individual provider: See Shea Evans

## 2020-12-17 NOTE — ED Notes (Signed)
Blood draw unsuccessful pt would like labs pulled from her IV line.

## 2020-12-17 NOTE — Plan of Care (Signed)
Team initially called at 7:57 am with a consult then called back stating to cancel consult and that the patient was pursuing comfort care and had expressed wishes to her family.    Please call back if needed.  Appreciate team's care of the patient at this difficult time  Claudia Desanctis, MD 8:29 AM 12/17/2020

## 2020-12-17 NOTE — ED Notes (Signed)
MD paged regarding pt K level.

## 2020-12-17 NOTE — ED Notes (Signed)
Patient moved to bed 14 at this time, external urinary catheter placed, placed on cardiac monitor x3

## 2020-12-17 NOTE — Progress Notes (Signed)
Full note to follow. Patient came to hospital seeking hospice care. Nearing end of life .   Sister at bedside. 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 Worsening shortness of breath for 3 weeks and unable to tolerate symptoms for 3 weeks . Tired and lethargic now.  Plan: Comfort care pathway. See inpatient hospice admission.

## 2020-12-17 NOTE — Progress Notes (Signed)
PROGRESS NOTE    Amy Herrera  [id number] DOB: June 06, 1951 DOA: 12/16/2020 PCP: Maximiano Coss, NP    Brief Narrative:  70 year old female with history of advanced kidney disease stage V , chronic leg edema, anemia, GERD, hypertension and type 2 diabetes presented to the emergency room with shortness of breath.  Patient lives alone.  She has a kidney disease for a long time but does not follow-up with nephrology.  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  She was managing her symptoms at home.  Her sister is her closest person.  For last 3 weeks or more, her shortness of breath is worsening, she knows that this is because of her kidneys, she is also having worsening swelling of the legs.  She was trying to stay home and not come to the hospital, however symptoms were too much and she could not function at home so came to the emergency room asking for comfort measures. In the emergency room, she is in obvious distress and tachypneic.  She has massive edema of the legs.  On initial interview, she was alert oriented and was expressing her wishes that she is looking forward for hospice level of care.  Found with obvious fluid overload.  Chest x-ray with pulmonary vascular congestion. She had expressed desire for comfort care and hospice, needed hospitalization for symptom control so hospitalist was consulted.   Assessment & Plan:   Principal Problem:   End of life care Active Problems:   T2DM (type 2 diabetes mellitus) (HCC)   Fluid overload   CKD (chronic kidney disease), stage V (HCC)   Anemia due to end stage renal disease (HCC)   Anasarca   End stage kidney disease (HCC)  Fluid overload with anasarca secondary to progressive renal disease, stage V chronic kidney disease now progressed to ESRD/hyperkalemia/uremia: Anemia of chronic disease Type 2 diabetes Essential hypertension Frailty and  debility Respiratory distress secondary to fluid overload  Plan: -Have detailed discussion with patient and patient's sister at the bedside.  Patient has good knowledge about kidney disease and she wants to pursue comfort care measures.  -Given level of respiratory distress and fluid overload, she has poor prognosis and chances of any meaningful improvement and is nearing death. -Start comfort care measures and hospice level of care in the hospital.  Adequate pain medications, symptom control medications including benzodiazepine, oxygen.  Unrestricted visitor policy. -Consult palliative care to further help provide end-of-life care while she is in the hospital. -RN to pronounce death if happens in the hospital. -Patient will benefit with end-of-life care at inpatient hospice facility if bed available, will transfer when available. -Do not draw labs, do not escalate care.    DVT prophylaxis: Comfort care   Code Status: Comfort and hospice Family Communication: Sister at the bedside Disposition Plan: Status is: Inpatient  Remains inpatient appropriate because:Unsafe d/c plan   Dispo: The patient is from: Home              Anticipated d/c is to: Inpatient hospice              Patient currently is not medically stable to d/c.   Difficult to place patient No         Consultants:   Palliative  Procedures:   None  Antimicrobials:   None   Subjective: Patient seen and examined.  Went to examine patient early morning hours.  She had received  2 doses of morphine at midnight, she was tachypneic and unable to keep up any conversation.  She would me that she is not feeling well.  Her sister was at the bedside.  We had detailed discussion with patient's sister, patient was not able to participate fully however she had expressed her desires clearly to ER doctor and admitting doctor.  She does not have any support at home and currently cannot manage symptoms at home so she will  benefit with inpatient hospice.  Objective: Vitals:   12/17/20 0930 12/17/20 0945 12/17/20 0949 12/17/20 1059  BP: 136/66 (!) 146/83 (!) 146/83 (!) 149/99  Pulse: 66 69 66 66  Resp: '12 15 15 20  '$ Temp:      TempSrc:      SpO2: 100% 100% 100% 100%  Weight:      Height:        Intake/Output Summary (Last 24 hours) at 12/17/2020 1125 Last data filed at 12/17/2020 [id number] Gross per 24 hour  Intake --  Output 200 ml  Net -200 ml   Filed Weights   12/16/20 2212  Weight: 90.7 kg    Examination:  General exam: Patient looks in moderate distress, sleepy and lethargic.   Respiratory system: Poor bilateral air entry.  Conducted upper airway sounds. Cardiovascular system: S1 & S2 heard, tachycardic. Massive bilateral pedal edema. Gastrointestinal system: Soft.  Obese and pendulous.   Central nervous system: Sleepy and lethargic.  Sick looking.  Follows simple commands.  Moves all extremities equally but very frail and debilitated.   Data Reviewed: I have personally reviewed following labs and imaging studies  CBC: Recent Labs  Lab 12/16/20 2314 12/17/20 0257  WBC 9.3 10.1  NEUTROABS 8.0*  --   HGB 6.5* 7.0*  HCT 22.9* 24.9*  MCV 85.4 86.8  PLT 257 [id number]   Basic Metabolic Panel: Recent Labs  Lab 12/16/20 2314 12/17/20 0500  NA 139 140  K 5.7* 6.3*  CL 113* 116*  CO2 14* 14*  GLUCOSE 147* 148*  BUN 81* 83*  CREATININE 7.56* 7.89*  CALCIUM 5.8* 6.0*   GFR: Estimated Creatinine Clearance: 7.7 mL/min (A) (by C-G formula based on SCr of 7.89 mg/dL (H)). Liver Function Tests: Recent Labs  Lab 12/17/20 0500  AST 22  ALT 25  ALKPHOS 51  BILITOT 0.5  PROT 6.1*  ALBUMIN 2.4*   No results for input(s): LIPASE, AMYLASE in the last 168 hours. No results for input(s): AMMONIA in the last 168 hours. Coagulation Profile: No results for input(s): INR, PROTIME in the last 168 hours. Cardiac Enzymes: No results for input(s): CKTOTAL, CKMB, CKMBINDEX, TROPONINI in the last  168 hours. BNP (last 3 results) No results for input(s): PROBNP in the last 8760 hours. HbA1C: No results for input(s): HGBA1C in the last 72 hours. CBG: No results for input(s): GLUCAP in the last 168 hours. Lipid Profile: No results for input(s): CHOL, HDL, LDLCALC, TRIG, CHOLHDL, LDLDIRECT in the last 72 hours. Thyroid Function Tests: No results for input(s): TSH, T4TOTAL, FREET4, T3FREE, THYROIDAB in the last 72 hours. Anemia Panel: No results for input(s): VITAMINB12, FOLATE, FERRITIN, TIBC, IRON, RETICCTPCT in the last 72 hours. Sepsis Labs: No results for input(s): PROCALCITON, LATICACIDVEN in the last 168 hours.  Recent Results (from the past 240 hour(s))  Resp Panel by RT-PCR (Flu A&B, Covid) Nasopharyngeal Swab     Status: None   Collection Time: 12/16/20 11:37 PM   Specimen: Nasopharyngeal Swab; Nasopharyngeal(NP) swabs in vial transport medium  Result  Value Ref Range Status   SARS Coronavirus 2 by RT PCR NEGATIVE NEGATIVE Final    Comment: (NOTE) SARS-CoV-2 target nucleic acids are NOT DETECTED.  The SARS-CoV-2 RNA is generally detectable in upper respiratory specimens during the acute phase of infection. The lowest concentration of SARS-CoV-2 viral copies this assay can detect is 138 copies/mL. A negative result does not preclude SARS-Cov-2 infection and should not be used as the sole basis for treatment or other patient management decisions. A negative result may occur with  improper specimen collection/handling, submission of specimen other than nasopharyngeal swab, presence of viral mutation(s) within the areas targeted by this assay, and inadequate number of viral copies(<138 copies/mL). A negative result must be combined with clinical observations, patient history, and epidemiological information. The expected result is Negative.  Fact Sheet for Patients:  EntrepreneurPulse.com.au  Fact Sheet for Healthcare Providers:   IncredibleEmployment.be  This test is no t yet approved or cleared by the [place] FDA and  has been authorized for detection and/or diagnosis of SARS-CoV-2 by FDA under an Emergency Use Authorization (EUA). This EUA will remain  in effect (meaning this test can be used) for the duration of the COVID-19 declaration under Section 564(b)(1) of the Act, 21 U.S.C.section 360bbb-3(b)(1), unless the authorization is terminated  or revoked sooner.       Influenza A by PCR NEGATIVE NEGATIVE Final   Influenza B by PCR NEGATIVE NEGATIVE Final    Comment: (NOTE) The Xpert Xpress SARS-CoV-2/FLU/RSV plus assay is intended as an aid in the diagnosis of influenza from Nasopharyngeal swab specimens and should not be used as a sole basis for treatment. Nasal washings and aspirates are unacceptable for Xpert Xpress SARS-CoV-2/FLU/RSV testing.  Fact Sheet for Patients: EntrepreneurPulse.com.au  Fact Sheet for Healthcare Providers: IncredibleEmployment.be  This test is not yet approved or cleared by the [place] FDA and has been authorized for detection and/or diagnosis of SARS-CoV-2 by FDA under an Emergency Use Authorization (EUA). This EUA will remain in effect (meaning this test can be used) for the duration of the COVID-19 declaration under Section 564(b)(1) of the Act, 21 U.S.C. section 360bbb-3(b)(1), unless the authorization is terminated or revoked.  Performed at [facility], [place] [address] [place], [place] [address]          Radiology Studies: DG Chest 1 View  Result Date: 12/16/2020 CLINICAL DATA:  Increasing shortness of breath for several days, swelling EXAM: CHEST  1 VIEW COMPARISON:  01/23/2018 FINDINGS: Single frontal view of the chest demonstrates borderline enlargement the cardiac silhouette. There is increased central vascular congestion without focal airspace disease, effusion, or pneumothorax. No  acute bony abnormalities. IMPRESSION: 1. Increased central vascular congestion without overt edema. Electronically Signed   By: Randa Ngo M.D.   On: 12/16/2020 23:20        Scheduled Meds: . furosemide  80 mg Intravenous Q12H  . sertraline  100 mg Oral QHS  . sodium bicarbonate  1,300 mg Oral BID   Continuous Infusions:   LOS: 0 days    Time spent: Additional 30 minutes.    Barb Merino, MD [facility] Pager [phone]

## 2020-12-17 NOTE — H&P (Signed)
History and Physical    Amy Herrera [id number] DOB: [birth year] DOA: 12/16/2020  PCP: Maximiano Coss, NP   Patient coming from: Home.  Chief Complaint: Shortness of breath.  HPI: Amy Herrera is a 70 y.o. female with history of chronic kidney disease stage V baseline creatinine around 6 and October 2021 with history of diabetes mellitus type 2, anemia, hypertension presents to the ER because of worsening shortness of breath.  Patient states over the last 1 week patient has become increasingly short of breath and has gained significant peripheral edema and abdominal distention.  Patient also has been having bilateral lower extreme edema which was weeping.  ED Course: In the ER on exam patient has significant bilateral lower extremity mild abdominal distention.  Patient finds it difficult to lie flat because of the shortness of breath.  Labs show potassium of 5.7 creatinine 7.56 which has worsened from 6 in October 2021.  Hemoglobin 6.5 patient has not noticed any blood in the stools.  Baseline hemoglobin in October 2021 was 8.4.  Patient at this time is clearly stated that she does not want dialysis but okay with medical treatment including IV Lasix and transfusions if required.  Patient admitted for further management of fluid overload anasarca with progressive renal failure.  COVID test was negative.  Review of Systems: As per HPI, rest all negative.   Past Medical History:  Diagnosis Date  . Anxiety   . Asthma   . Depression   . GERD (gastroesophageal reflux disease)   . Hypertension   . Obesity   . Tobacco abuse   . Type II or unspecified type diabetes mellitus without mention of complication, not stated as uncontrolled     Past Surgical History:  Procedure Laterality Date  . TONSILLECTOMY    . TUBAL LIGATION       reports that she quit smoking about 5 years ago. Her smoking use included cigarettes. She has a 20.00 pack-year smoking history. She has never used smokeless  tobacco. She reports that she does not drink alcohol and does not use drugs.  Allergies  Allergen Reactions  . Sulfa Antibiotics Swelling    Face/mouth  . Codeine Hives    Family History  Problem Relation Age of Onset  . Cancer Mother        lung  . Heart attack Father   . Heart disease Father   . Heart disease Sister   . Hypertension Sister   . Muscular dystrophy Son   . Muscular dystrophy Cousin   . Muscular dystrophy Cousin   . Muscular dystrophy Cousin     Prior to Admission medications   Medication Sig Start Date End Date Taking? Authorizing Provider  aspirin 81 MG tablet Take 81 mg by mouth daily.   Yes [provider]  atorvastatin (LIPITOR) 20 MG tablet TAKE 1 TABLET BY MOUTH EVERYDAY AT BEDTIME Patient taking differently: Take 20 mg by mouth at bedtime. 05/04/20  Yes Maximiano Coss, NP  carvedilol (COREG) 25 MG tablet Take 25 mg by mouth 2 (two) times daily with a meal.   Yes [provider]  furosemide (LASIX) 80 MG tablet Take 40 mg by mouth daily.   Yes [provider]  hydrALAZINE (APRESOLINE) 100 MG tablet Take 100 mg by mouth 3 (three) times daily. 03/25/17  Yes [provider]  pramipexole (MIRAPEX) 0.5 MG tablet TAKE 1 TABLET BY MOUTH EVERYDAY AT BEDTIME Patient taking differently: Take 0.5 mg by mouth at bedtime. 10/05/20  Yes Maximiano Coss, NP  repaglinide (PRANDIN) 0.5 MG tablet TAKE 1 TABLET BY MOUTH 3 TIMES DAILY BEFORE MEALS Patient taking differently: Take 0.5 mg by mouth 3 (three) times daily before meals. 11/15/20  Yes Maximiano Coss, NP  sertraline (ZOLOFT) 100 MG tablet TAKE 1 TABLET BY MOUTH EVERYDAY AT BEDTIME Patient taking differently: Take 100 mg by mouth at bedtime. 10/25/20  Yes Maximiano Coss, NP  sodium bicarbonate 650 MG tablet Take 1,300 mg by mouth 2 (two) times daily. 09/23/17  Yes [provider]  hydrOXYzine (ATARAX/VISTARIL) 10 MG tablet Take 1 tablet (10 mg total) by mouth 2 (two) times  daily. Patient not taking: No sig reported 05/04/20   Maximiano Coss, NP    Physical Exam: Constitutional: Moderately built and nourished. Vitals:   12/16/20 2212 12/16/20 2353 12/17/20 0100 12/17/20 0237  BP:  138/83 (!) 160/86 (!) 147/84  Pulse:  70 70 77  Resp:  (!) 24 20 (!) 26  Temp:      TempSrc:      SpO2:  96% 99% 100%  Weight: 90.7 kg     Height: '5\' 5"'$  (1.651 m)      Eyes: Anicteric no pallor. ENMT: No discharge from the ears eyes nose or mouth. Neck: No mass felt.  No neck rigidity. Respiratory: No rhonchi or crepitations. Cardiovascular: S1-S2 heard. Abdomen: Soft nontender bowel sound present. Musculoskeletal: Bilateral lower extremity edema present. Skin: Chronic skin changes. Neurologic: Alert awake oriented to time place and person.  Moves all extremities. Psychiatric: Appears normal.  Normal affect.   Labs on Admission: I have personally reviewed following labs and imaging studies  CBC: Recent Labs  Lab 12/16/20 2314  WBC 9.3  NEUTROABS 8.0*  HGB 6.5*  HCT 22.9*  MCV 85.4  PLT [id number]   Basic Metabolic Panel: Recent Labs  Lab 12/16/20 2314  NA 139  K 5.7*  CL 113*  CO2 14*  GLUCOSE 147*  BUN 81*  CREATININE 7.56*  CALCIUM 5.8*   GFR: Estimated Creatinine Clearance: 8 mL/min (A) (by C-G formula based on SCr of 7.56 mg/dL (H)). Liver Function Tests: No results for input(s): AST, ALT, ALKPHOS, BILITOT, PROT, ALBUMIN in the last 168 hours. No results for input(s): LIPASE, AMYLASE in the last 168 hours. No results for input(s): AMMONIA in the last 168 hours. Coagulation Profile: No results for input(s): INR, PROTIME in the last 168 hours. Cardiac Enzymes: No results for input(s): CKTOTAL, CKMB, CKMBINDEX, TROPONINI in the last 168 hours. BNP (last 3 results) No results for input(s): PROBNP in the last 8760 hours. HbA1C: No results for input(s): HGBA1C in the last 72 hours. CBG: No results for input(s): GLUCAP in the last 168 hours. Lipid  Profile: No results for input(s): CHOL, HDL, LDLCALC, TRIG, CHOLHDL, LDLDIRECT in the last 72 hours. Thyroid Function Tests: No results for input(s): TSH, T4TOTAL, FREET4, T3FREE, THYROIDAB in the last 72 hours. Anemia Panel: No results for input(s): VITAMINB12, FOLATE, FERRITIN, TIBC, IRON, RETICCTPCT in the last 72 hours. Urine analysis:    Component Value Date/Time   COLORURINE YELLOW 01/23/2018 0740   APPEARANCEUR CLEAR 01/23/2018 0740   LABSPEC 1.013 01/23/2018 0740   PHURINE 5.0 01/23/2018 0740   GLUCOSEU 50 (A) 01/23/2018 0740   HGBUR NEGATIVE 01/23/2018 0740   BILIRUBINUR NEGATIVE 01/23/2018 0740   KETONESUR NEGATIVE 01/23/2018 0740   PROTEINUR 100 (A) 01/23/2018 0740   NITRITE NEGATIVE 01/23/2018 0740   LEUKOCYTESUR SMALL (A) 01/23/2018 0740   Sepsis Labs: '@LABRCNTIP'$ (procalcitonin:4,lacticidven:4) ) Recent Results (  from the past 240 hour(s))  Resp Panel by RT-PCR (Flu A&B, Covid) Nasopharyngeal Swab     Status: None   Collection Time: 12/16/20 11:37 PM   Specimen: Nasopharyngeal Swab; Nasopharyngeal(NP) swabs in vial transport medium  Result Value Ref Range Status   SARS Coronavirus 2 by RT PCR NEGATIVE NEGATIVE Final    Comment: (NOTE) SARS-CoV-2 target nucleic acids are NOT DETECTED.  The SARS-CoV-2 RNA is generally detectable in upper respiratory specimens during the acute phase of infection. The lowest concentration of SARS-CoV-2 viral copies this assay can detect is 138 copies/mL. A negative result does not preclude SARS-Cov-2 infection and should not be used as the sole basis for treatment or other patient management decisions. A negative result may occur with  improper specimen collection/handling, submission of specimen other than nasopharyngeal swab, presence of viral mutation(s) within the areas targeted by this assay, and inadequate number of viral copies(<138 copies/mL). A negative result must be combined with clinical observations, patient history, and  epidemiological information. The expected result is Negative.  Fact Sheet for Patients:  EntrepreneurPulse.com.au  Fact Sheet for Healthcare Providers:  IncredibleEmployment.be  This test is no t yet approved or cleared by the [place] FDA and  has been authorized for detection and/or diagnosis of SARS-CoV-2 by FDA under an Emergency Use Authorization (EUA). This EUA will remain  in effect (meaning this test can be used) for the duration of the COVID-19 declaration under Section 564(b)(1) of the Act, 21 U.S.C.section 360bbb-3(b)(1), unless the authorization is terminated  or revoked sooner.       Influenza A by PCR NEGATIVE NEGATIVE Final   Influenza B by PCR NEGATIVE NEGATIVE Final    Comment: (NOTE) The Xpert Xpress SARS-CoV-2/FLU/RSV plus assay is intended as an aid in the diagnosis of influenza from Nasopharyngeal swab specimens and should not be used as a sole basis for treatment. Nasal washings and aspirates are unacceptable for Xpert Xpress SARS-CoV-2/FLU/RSV testing.  Fact Sheet for Patients: EntrepreneurPulse.com.au  Fact Sheet for Healthcare Providers: IncredibleEmployment.be  This test is not yet approved or cleared by the [place] FDA and has been authorized for detection and/or diagnosis of SARS-CoV-2 by FDA under an Emergency Use Authorization (EUA). This EUA will remain in effect (meaning this test can be used) for the duration of the COVID-19 declaration under Section 564(b)(1) of the Act, 21 U.S.C. section 360bbb-3(b)(1), unless the authorization is terminated or revoked.  Performed at [facility], [place] [address], [place], [place] [address]      Radiological Exams on Admission: DG Chest 1 View  Result Date: 12/16/2020 CLINICAL DATA:  Increasing shortness of breath for several days, swelling EXAM: CHEST  1 VIEW COMPARISON:  01/23/2018 FINDINGS: Single frontal view  of the chest demonstrates borderline enlargement the cardiac silhouette. There is increased central vascular congestion without focal airspace disease, effusion, or pneumothorax. No acute bony abnormalities. IMPRESSION: 1. Increased central vascular congestion without overt edema. Electronically Signed   By: Randa Ngo M.D.   On: 12/16/2020 23:20      Assessment/Plan Principal Problem:   Fluid overload Active Problems:   T2DM (type 2 diabetes mellitus) (HCC)   CKD (chronic kidney disease), stage V (HCC)   Anemia due to end stage renal disease (HCC)   Anasarca    1. Fluid overload with anasarca secondary to progressive renal disease stage V kidney disease at this time patient is clearly stated that she does not want to be on dialysis but okay with medical  management.  Patient states if she does not improve she wants to be changed to comfort measures.  I have ordered Lasix 80 mg IV Q 12 for now along with Lokelma for hyperkalemia.  EKG is pending.  Closely follow intake output metabolic panel.  Consult nephrology. 2. Worsening anemia likely from renal disease.  At this time since patient is fluid overloaded we will hold off transfusion until patient's fluid status improves.  Type and screen.  Follow CBC. 3. Diabetes mellitus type 2 we will keep patient on sliding scale coverage. 4. Hypertension on hydralazine and Coreg. 5. History of stroke on statins and aspirin. 6. History of depression on Zoloft.  Since patient has worsening renal disease with fluid overload with hyperkalemia will need close monitoring for any further worsening in inpatient status.   DVT prophylaxis: Heparin. Code Status: DNR. Family Communication: Patient's sister at the bedside. Disposition Plan: To be determined. Consults called: Will need to consult nephrology. Admission status: Inpatient.   Rise Patience MD [facility] Pager [phone].  If 7PM-7AM, please contact  night-coverage [email or web address] Password [facility]  12/17/2020, 2:59 AM

## 2020-12-18 DIAGNOSIS — Z515 Encounter for palliative care: Secondary | ICD-10-CM | POA: Diagnosis not present

## 2020-12-18 NOTE — Progress Notes (Signed)
   12/18/20 1132  Clinical Encounter Type  Visited With Patient and family together  Visit Type Initial;Patient actively dying  Referral From Palliative care team   Chaplain responded to a consult, as patient has transitioned to comfort care measures. Chaplain met with patient and patient's sister, [name]. Patient is fully aware of what is going on and what is ahead, and both patient and sister indicated being okay. Patient chose to share a bit of the positive experiences those two have had and not wishing to be sad. Chaplain offered empathic listening and support. Chaplain introduced spiritual care services. Spiritual care services available as needed.   Jeri Lager, Chaplain

## 2020-12-18 NOTE — TOC Progression Note (Cosign Needed)
Transition of Care [facility]) - Progression Note    Patient Details  Name: Amy Herrera MRN: [id number] Date of Birth: Sep 03, 1950  Transition of Care [facility]) CM/SW Contact  Terrah Decoster, [place], [place] Phone [place] 12/18/2020, 10:07 AM  Clinical Narrative:    Phone call to patient's sister to discuss choice for residential hospice. Patient's sister Amy Herrera has chosen [facility]. Hospice liaison [facility] notified.   [address] LCSW Transition of Care [phone]         Expected Discharge Plan and Services                                                 Social Determinants of Health (SDOH) Interventions    Readmission Risk Interventions No flowsheet data found.

## 2020-12-18 NOTE — Progress Notes (Signed)
PROGRESS NOTE    Amy Herrera  [id number] DOB: 09-05-1950 DOA: 12/16/2020 PCP: Maximiano Coss, NP    Brief Narrative:  70 year old female with history of advanced kidney disease stage V , chronic leg edema, anemia, GERD, hypertension and type 2 diabetes presented to the emergency room with shortness of breath.  Patient lives alone.  She has a kidney disease for a long time and followed up with [facility]. 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  She was managing her symptoms at home.  Her sister is her closest person.  For last 3 weeks or more, her shortness of breath is worsening, she knows that this is because of her kidneys, she is also having worsening swelling of the legs.  She was trying to stay home and not come to the hospital, however symptoms were too much and she could not function at home so came to the emergency room asking for comfort measures.  In the emergency room, she was in obvious distress and tachypneic.  She has edema of the legs.  On initial interview, she was alert oriented and was expressing her wishes that she is looking forward for hospice level of care.  Found with obvious fluid overload.  Chest x-ray with pulmonary vascular congestion. She had expressed desire for comfort care and hospice, needed hospitalization for symptom control so hospitalist was consulted.  5/28, symptomatic treatment and comfort care initiated. 5/29, patient more awake, symptom well-controlled with oral pain medications.  Feels comfortable.  No repeat labs are done per patient's wishes.   Assessment & Plan:   Principal Problem:   End of life care Active Problems:   T2DM (type 2 diabetes mellitus) ([place])   Fluid overload   CKD (chronic kidney disease), stage V (HCC)   Anemia due to end stage renal disease (HCC)   Anasarca   End stage kidney disease (HCC)  Fluid overload with anasarca secondary to  progressive renal disease, stage V chronic kidney disease now progressed to ESRD/hyperkalemia/uremia: Anemia of chronic disease Type 2 diabetes Essential hypertension Advance frailty and debility Respiratory distress secondary to fluid overload  Plan of care: -Patient was in obvious respiratory distress so comfort care hospice measures started on 5/28.  Patient is stabilizing now. -Palliative care following. -RN to pronounce death if happens in the hospital. -Patient will benefit with end-of-life care at inpatient hospice facility if bed available, will transfer when available. -Do not draw labs, do not escalate care.  Patient is more awake and able to have normal interaction today.  She still desires to continue comfort care measures.  DVT prophylaxis: Comfort care   Code Status: DNR. Family Communication: None today. Disposition Plan: Status is: Inpatient  Remains inpatient appropriate because:Unsafe d/c plan   Dispo: The patient is from: Home              Anticipated d/c is to: Inpatient hospice              Patient currently is not medically stable to d/c.   Difficult to place patient No         Consultants:   Palliative  Procedures:   None  Antimicrobials:   None   Subjective: Patient seen and examined.  She is much more comfortable today.  Does have occasional episodes of wheezing and chest tightness.  She has not mobilized.  Denies any nausea or vomiting.  She was able to eat  some food. She was not sure she can walk as she does not feel like she will be able to stand on her legs.  IV site hurts. Patient is aware that we are pursuing hospice placement. Foley catheter was placed for discomfort, has 450 ml of urine.   Objective: Vitals:   12/17/20 1206 12/17/20 1223 12/17/20 1355 12/18/20 0522  BP: (!) 157/69  127/64 130/67  Pulse: 69  71 84  Resp: '16  16 17  '$ Temp:  98.1 F (36.7 C) (!) 97.3 F (36.3 C) 98.4 F (36.9 C)  TempSrc:  Oral Oral Oral   SpO2: 100%  100% 100%  Weight:      Height:        Intake/Output Summary (Last 24 hours) at 12/18/2020 1353 Last data filed at 12/18/2020 0144 Gross per 24 hour  Intake 0 ml  Output 250 ml  Net -250 ml   Filed Weights   12/16/20 2212  Weight: 90.7 kg    Examination:  General exam: Patient looks in mild distress.  Today she is alert awake and oriented. Respiratory system: Poor bilateral air entry.  Occasional expiratory wheezes. Cardiovascular system: S1 & S2 heard, tachycardic.  2+ bilateral pedal edema. Gastrointestinal system: Soft.  Obese and pendulous.   Central nervous system: Alert awake and oriented.  Normal mood.  Comfortable.  Data Reviewed: I have personally reviewed following labs and imaging studies  CBC: Recent Labs  Lab 12/16/20 2314 12/17/20 0257  WBC 9.3 10.1  NEUTROABS 8.0*  --   HGB 6.5* 7.0*  HCT 22.9* 24.9*  MCV 85.4 86.8  PLT 257 [id number]   Basic Metabolic Panel: Recent Labs  Lab 12/16/20 2314 12/17/20 0500  NA 139 140  K 5.7* 6.3*  CL 113* 116*  CO2 14* 14*  GLUCOSE 147* 148*  BUN 81* 83*  CREATININE 7.56* 7.89*  CALCIUM 5.8* 6.0*   GFR: Estimated Creatinine Clearance: 7.7 mL/min (A) (by C-G formula based on SCr of 7.89 mg/dL (H)). Liver Function Tests: Recent Labs  Lab 12/17/20 0500  AST 22  ALT 25  ALKPHOS 51  BILITOT 0.5  PROT 6.1*  ALBUMIN 2.4*   No results for input(s): LIPASE, AMYLASE in the last 168 hours. No results for input(s): AMMONIA in the last 168 hours. Coagulation Profile: No results for input(s): INR, PROTIME in the last 168 hours. Cardiac Enzymes: No results for input(s): CKTOTAL, CKMB, CKMBINDEX, TROPONINI in the last 168 hours. BNP (last 3 results) No results for input(s): PROBNP in the last 8760 hours. HbA1C: No results for input(s): HGBA1C in the last 72 hours. CBG: No results for input(s): GLUCAP in the last 168 hours. Lipid Profile: No results for input(s): CHOL, HDL, LDLCALC, TRIG, CHOLHDL,  LDLDIRECT in the last 72 hours. Thyroid Function Tests: No results for input(s): TSH, T4TOTAL, FREET4, T3FREE, THYROIDAB in the last 72 hours. Anemia Panel: No results for input(s): VITAMINB12, FOLATE, FERRITIN, TIBC, IRON, RETICCTPCT in the last 72 hours. Sepsis Labs: No results for input(s): PROCALCITON, LATICACIDVEN in the last 168 hours.  Recent Results (from the past 240 hour(s))  Resp Panel by RT-PCR (Flu A&B, Covid) Nasopharyngeal Swab     Status: None   Collection Time: 12/16/20 11:37 PM   Specimen: Nasopharyngeal Swab; Nasopharyngeal(NP) swabs in vial transport medium  Result Value Ref Range Status   SARS Coronavirus 2 by RT PCR NEGATIVE NEGATIVE Final    Comment: (NOTE) SARS-CoV-2 target nucleic acids are NOT DETECTED.  The SARS-CoV-2 RNA is generally detectable in  upper respiratory specimens during the acute phase of infection. The lowest concentration of SARS-CoV-2 viral copies this assay can detect is 138 copies/mL. A negative result does not preclude SARS-Cov-2 infection and should not be used as the sole basis for treatment or other patient management decisions. A negative result may occur with  improper specimen collection/handling, submission of specimen other than nasopharyngeal swab, presence of viral mutation(s) within the areas targeted by this assay, and inadequate number of viral copies(<138 copies/mL). A negative result must be combined with clinical observations, patient history, and epidemiological information. The expected result is Negative.  Fact Sheet for Patients:  EntrepreneurPulse.com.au  Fact Sheet for Healthcare Providers:  IncredibleEmployment.be  This test is no t yet approved or cleared by the [place] FDA and  has been authorized for detection and/or diagnosis of SARS-CoV-2 by FDA under an Emergency Use Authorization (EUA). This EUA will remain  in effect (meaning this test can be used) for the  duration of the COVID-19 declaration under Section 564(b)(1) of the Act, 21 U.S.C.section 360bbb-3(b)(1), unless the authorization is terminated  or revoked sooner.       Influenza A by PCR NEGATIVE NEGATIVE Final   Influenza B by PCR NEGATIVE NEGATIVE Final    Comment: (NOTE) The Xpert Xpress SARS-CoV-2/FLU/RSV plus assay is intended as an aid in the diagnosis of influenza from Nasopharyngeal swab specimens and should not be used as a sole basis for treatment. Nasal washings and aspirates are unacceptable for Xpert Xpress SARS-CoV-2/FLU/RSV testing.  Fact Sheet for Patients: EntrepreneurPulse.com.au  Fact Sheet for Healthcare Providers: IncredibleEmployment.be  This test is not yet approved or cleared by the [place] FDA and has been authorized for detection and/or diagnosis of SARS-CoV-2 by FDA under an Emergency Use Authorization (EUA). This EUA will remain in effect (meaning this test can be used) for the duration of the COVID-19 declaration under Section 564(b)(1) of the Act, 21 U.S.C. section 360bbb-3(b)(1), unless the authorization is terminated or revoked.  Performed at [facility], [place] [address], [place], [place] [address]          Radiology Studies: DG Chest 1 View  Result Date: 12/16/2020 CLINICAL DATA:  Increasing shortness of breath for several days, swelling EXAM: CHEST  1 VIEW COMPARISON:  01/23/2018 FINDINGS: Single frontal view of the chest demonstrates borderline enlargement the cardiac silhouette. There is increased central vascular congestion without focal airspace disease, effusion, or pneumothorax. No acute bony abnormalities. IMPRESSION: 1. Increased central vascular congestion without overt edema. Electronically Signed   By: Randa Ngo M.D.   On: 12/16/2020 23:20        Scheduled Meds: . furosemide  80 mg Intravenous Q12H  . sertraline  100 mg Oral QHS  . sodium bicarbonate  1,300 mg Oral  BID   Continuous Infusions:   LOS: 1 day    Time spent: 30 minutes    Barb Merino, MD [facility] Pager [phone]

## 2020-12-18 NOTE — Progress Notes (Signed)
Manufacturing engineer [facility])  Referral received for [facility] for EOL care.  There is not a bed open today for Amy Herrera and eligibility will need to be confirmed first by our MD.  Once eligibility is confirmed, [facility] will reach out to family to discuss BP.  Thank you, Venia Carbon RN, BSN, [facility] Liaison

## 2020-12-19 DIAGNOSIS — R601 Generalized edema: Secondary | ICD-10-CM | POA: Diagnosis not present

## 2020-12-19 DIAGNOSIS — E8770 Fluid overload, unspecified: Secondary | ICD-10-CM | POA: Diagnosis not present

## 2020-12-19 DIAGNOSIS — N186 End stage renal disease: Secondary | ICD-10-CM | POA: Diagnosis not present

## 2020-12-19 DIAGNOSIS — Z515 Encounter for palliative care: Secondary | ICD-10-CM | POA: Diagnosis not present

## 2020-12-19 MED ORDER — TRAZODONE HCL 100 MG PO TABS
100.0000 mg | ORAL_TABLET | Freq: Every day | ORAL | Status: DC
  Administered 2020-12-19 – 2020-12-20 (×2): 100 mg via ORAL
  Filled 2020-12-19 (×2): qty 1

## 2020-12-19 MED ORDER — LORAZEPAM 0.5 MG PO TABS
0.5000 mg | ORAL_TABLET | Freq: Three times a day (TID) | ORAL | Status: DC
  Administered 2020-12-19 – 2020-12-20 (×5): 0.5 mg via ORAL
  Filled 2020-12-19 (×5): qty 1

## 2020-12-19 NOTE — Progress Notes (Signed)
Daily Progress Note   Patient Name: Amy Herrera       Date: 12/19/2020 DOB: 1951/02/23  Age: 70 y.o. MRN#: [id number] Attending Physician: Barb Merino, MD Primary Care Physician: Maximiano Coss, NP Admit Date: 12/16/2020  Reason for Consultation/Follow-up: end of life care, symptom management  Subjective: Discussed with NP Wadie Lessen, who had discussed with Dr. Sloan Leiter earlier today. PMT communicated to Dr. Sloan Leiter that patient remains appropriate for residential hospice.   At bedside, patient appears to be resting comfortably. No non-verbal signs of pain or discomfort noted. Respirations are even and unlabored. No excessive respiratory secretions noted. She is somewhat difficult to arouse - would not awaken to voice but did open her eyes to light touch on the shoulder. She states that she is comfortable and quickly falls back asleep.     Length of Stay: 2  Current Medications: Scheduled Meds:  . furosemide  80 mg Intravenous Q12H  . LORazepam  0.5 mg Oral Q8H  . sertraline  100 mg Oral QHS  . sodium bicarbonate  1,300 mg Oral BID  . traZODone  100 mg Oral QHS       PRN Meds: acetaminophen **OR** acetaminophen, albuterol, haloperidol **OR** haloperidol **OR** haloperidol lactate, HYDROmorphone (DILAUDID) injection, LORazepam **OR** LORazepam **OR** LORazepam, ondansetron **OR** ondansetron (ZOFRAN) IV, oxyCODONE       Vital Signs: BP (!) 161/70 (BP Location: Left Arm)   Pulse 77   Temp 98.3 F (36.8 C) (Oral)   Resp 17   Ht '5\' 5"'$  (1.651 m)   Wt 90.7 kg   SpO2 100%   BMI 33.28 kg/m  SpO2: SpO2: 100 % O2 Device: O2 Device: Nasal Cannula O2 Flow Rate: O2 Flow Rate (L/min): 2 L/min  Intake/output summary:   Intake/Output Summary (Last 24 hours) at 12/19/2020  1645 Last data filed at 12/19/2020 0850 Gross per 24 hour  Intake 237 ml  Output 950 ml  Net -713 ml   LBM:   Baseline Weight: Weight: 90.7 kg Most recent weight: Weight: 90.7 kg       Palliative Assessment/Data: PPS 20%      Palliative Care Assessment & Plan   HPI/Patient Profile: 70 y.o. female  with past medical history of chronic kidney disease stage V (baseline creatinine around 6), type 2 DM, anemia, and hypertension who presented  to the emergency department on 12/16/2020 with worsening shortness of breath over the past week. Patient also reporting significant peripheral edema and abdominal distention.  ED Course: Labs show potassium 5.7, creatinine 7.56, and hemoglobin 6.5. Patient initially stated she does not want dialysis but is okay with medical treatment such as IV lasix and blood transfusions if required. She stated if she does not improve she would want to be transitioned to comfort measures. Lasix 80 mg IV every 12 hours ordered along with Lokelma for hyperkalemia.  Patient admitted to [facility] for further management of fluid overload, anasarca, and progressive renal failure.    Assessment: - end stage renal disease - anasarca - fluid overload - end of life care  Recommendations/Plan:  Continue full comfort measures  DNR/DNI as previously documented  Transfer to [facility] when bed becomes available   Dilaudid 0.5 mg every 2 hours PRN pain or dyspnea  Provide frequent assessments and administer PRN medications as clinically necessary to ensure EOL comfort  PMT will continue to follow    Goals of Care and Additional Recommendations:  Limitations on Scope of Treatment: Full Comfort Care  Code Status: DNR/DNI  Prognosis:   < 2 weeks  Discharge Planning:  Hospice facility   Thank you for allowing the Palliative Medicine Team to assist in the care of this patient.   Total Time 15 minutes Prolonged Time Billed  no       Greater than 50%  of  this time was spent counseling and coordinating care related to the above assessment and plan.  Lavena Bullion, NP  Please contact Palliative Medicine Team phone at [phone] for questions and concerns.

## 2020-12-19 NOTE — Progress Notes (Signed)
PT Cancellation Note  Patient Details Name: Amy Herrera MRN: [id number] DOB: Jun 16, 1951   Cancelled Treatment:    Reason Eval/Treat Not Completed: PT screened, no needs identified, will sign off Pt plans to d/c to residential hospice. No skilled PT needs at this time. If needs change, please re-consult.   Lou Miner, DPT  Acute Rehabilitation Services  Pager: [phone] Office: [phone]    Rudean Hitt 12/19/2020, 8:13 AM

## 2020-12-19 NOTE — Progress Notes (Addendum)
[facility]) Hospital Liaison RN note  Unfortunately, [facility] is not able to offer a room today. Spoke with patient and Magdalen Spatz, RN with TOC. Hospice eligibility is confirmed.  Patient and [facility] aware hospital liaison will follow up tomorrow or sooner if bed becomes available.  Please call with any hospice related questions or concerns.  Thank you, Margaretmary Eddy, BSN, RN [facility] Liaison [phone]

## 2020-12-19 NOTE — Progress Notes (Signed)
PROGRESS NOTE    SYBOL PAICE  [id number] DOB: 1951-06-26 DOA: 12/16/2020 PCP: Maximiano Coss, NP    Brief Narrative:  70 year old female with history of advanced kidney disease stage V , chronic leg edema, anemia, GERD, hypertension and type 2 diabetes presented to the emergency room with shortness of breath.  Patient lives alone.  She has a kidney disease for a long time and followed up with [facility]. 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  She was managing her symptoms at home.  Her sister is her closest person.  For last 3 weeks or more, her shortness of breath is worsening, she knows that this is because of her kidneys, she is also having worsening swelling of the legs.  She was trying to stay home and not come to the hospital, however symptoms were too much and she could not function at home so came to the emergency room asking for comfort measures.  In the emergency room, she was in obvious distress and tachypneic.  She has edema of the legs.  On initial interview, she was alert oriented and was expressing her wishes that she is looking forward for hospice level of care.  Found with obvious fluid overload.  Chest x-ray with pulmonary vascular congestion. She had expressed desire for comfort care and hospice, needed hospitalization for symptom control so hospitalist was consulted.  5/28, symptomatic treatment and comfort care initiated. 5/29-30, patient more awake, symptom well-controlled with oral pain medications.  Feels comfortable.  No repeat labs are done per patient's wishes and waiting for inpatient hospice bed.    Assessment & Plan:   Principal Problem:   End of life care Active Problems:   T2DM (type 2 diabetes mellitus) ([place])   Fluid overload   CKD (chronic kidney disease), stage V (HCC)   Anemia due to end stage renal disease (HCC)   Anasarca   End stage kidney disease (HCC)  Fluid  overload with anasarca secondary to progressive renal disease, stage V chronic kidney disease now progressed to ESRD/hyperkalemia/uremia: Anemia of chronic disease Type 2 diabetes Essential hypertension Advance frailty and debility Respiratory distress secondary to fluid overload  Plan of care: -Patient was in obvious respiratory distress so comfort care hospice measures started on 5/28.  Patient is stabilizing now. -Palliative care following. -RN to pronounce death if happens in the hospital. -Patient will benefit with end-of-life care at inpatient hospice facility if bed available, will transfer when available. -Do not draw labs, do not escalate care. -Patient does not want to use any medications, will continue Lasix for symptomatic treatment, will change to oral Lasix.  Continue bicarbonate. We will use trazodone at night.  We will start patient on scheduled a small dose of Ativan.   DVT prophylaxis: Comfort care   Code Status: DNR. Family Communication: None today.  Sister [name] is next of kin. Disposition Plan: Status is: Inpatient  Remains inpatient appropriate because:Unsafe d/c plan   Dispo: The patient is from: Home              Anticipated d/c is to: Inpatient hospice              Patient currently is not medically stable to d/c.   Difficult to place patient No         Consultants:   Palliative  Procedures:   None  Antimicrobials:   None   Subjective: Patient seen and examined.  She had poor sleep at night and feels tired, however she did not take any medications.  Intermittent shortness of breath and chest tightness present.  Comfortable at the time of my evaluation.  No family at bedside. She is aware about waiting for hospice bed.  Objective: Vitals:   12/17/20 1355 12/18/20 0522 12/18/20 1436 12/19/20 0404  BP: 127/64 130/67 130/62 (!) 161/70  Pulse: 71 84 77 77  Resp: '16 17 16 17  '$ Temp: (!) 97.3 F (36.3 C) 98.4 F (36.9 C) 98.1 F (36.7  C) 98.3 F (36.8 C)  TempSrc: Oral Oral Oral Oral  SpO2: 100% 100% 100% 100%  Weight:      Height:        Intake/Output Summary (Last 24 hours) at 12/19/2020 1354 Last data filed at 12/19/2020 0850 Gross per 24 hour  Intake 237 ml  Output 950 ml  Net -713 ml   Filed Weights   12/16/20 2212  Weight: 90.7 kg    Examination:  General exam: Patient looks in mild distress and anxious.she is alert, awake and oriented. Respiratory system: Poor bilateral air entry.  Occasional expiratory wheezes. Cardiovascular system: S1 & S2 heard, tachycardic.  3+ bilateral pedal edema. Gastrointestinal system: Soft.  Obese and pendulous.   Central nervous system: Alert awake and oriented.  Anxious.  Fairly comfortable looking.  Data Reviewed: I have personally reviewed following labs and imaging studies  CBC: Recent Labs  Lab 12/16/20 2314 12/17/20 0257  WBC 9.3 10.1  NEUTROABS 8.0*  --   HGB 6.5* 7.0*  HCT 22.9* 24.9*  MCV 85.4 86.8  PLT 257 [id number]   Basic Metabolic Panel: Recent Labs  Lab 12/16/20 2314 12/17/20 0500  NA 139 140  K 5.7* 6.3*  CL 113* 116*  CO2 14* 14*  GLUCOSE 147* 148*  BUN 81* 83*  CREATININE 7.56* 7.89*  CALCIUM 5.8* 6.0*   GFR: Estimated Creatinine Clearance: 7.7 mL/min (A) (by C-G formula based on SCr of 7.89 mg/dL (H)). Liver Function Tests: Recent Labs  Lab 12/17/20 0500  AST 22  ALT 25  ALKPHOS 51  BILITOT 0.5  PROT 6.1*  ALBUMIN 2.4*   No results for input(s): LIPASE, AMYLASE in the last 168 hours. No results for input(s): AMMONIA in the last 168 hours. Coagulation Profile: No results for input(s): INR, PROTIME in the last 168 hours. Cardiac Enzymes: No results for input(s): CKTOTAL, CKMB, CKMBINDEX, TROPONINI in the last 168 hours. BNP (last 3 results) No results for input(s): PROBNP in the last 8760 hours. HbA1C: No results for input(s): HGBA1C in the last 72 hours. CBG: No results for input(s): GLUCAP in the last 168 hours. Lipid  Profile: No results for input(s): CHOL, HDL, LDLCALC, TRIG, CHOLHDL, LDLDIRECT in the last 72 hours. Thyroid Function Tests: No results for input(s): TSH, T4TOTAL, FREET4, T3FREE, THYROIDAB in the last 72 hours. Anemia Panel: No results for input(s): VITAMINB12, FOLATE, FERRITIN, TIBC, IRON, RETICCTPCT in the last 72 hours. Sepsis Labs: No results for input(s): PROCALCITON, LATICACIDVEN in the last 168 hours.  Recent Results (from the past 240 hour(s))  Resp Panel by RT-PCR (Flu A&B, Covid) Nasopharyngeal Swab     Status: None   Collection Time: 12/16/20 11:37 PM   Specimen: Nasopharyngeal Swab; Nasopharyngeal(NP) swabs in vial transport medium  Result Value Ref Range Status   SARS Coronavirus 2 by RT PCR NEGATIVE NEGATIVE Final    Comment: (NOTE) SARS-CoV-2 target nucleic acids are NOT DETECTED.  The SARS-CoV-2 RNA is generally detectable in  upper respiratory specimens during the acute phase of infection. The lowest concentration of SARS-CoV-2 viral copies this assay can detect is 138 copies/mL. A negative result does not preclude SARS-Cov-2 infection and should not be used as the sole basis for treatment or other patient management decisions. A negative result may occur with  improper specimen collection/handling, submission of specimen other than nasopharyngeal swab, presence of viral mutation(s) within the areas targeted by this assay, and inadequate number of viral copies(<138 copies/mL). A negative result must be combined with clinical observations, patient history, and epidemiological information. The expected result is Negative.  Fact Sheet for Patients:  EntrepreneurPulse.com.au  Fact Sheet for Healthcare Providers:  IncredibleEmployment.be  This test is no t yet approved or cleared by the [place] FDA and  has been authorized for detection and/or diagnosis of SARS-CoV-2 by FDA under an Emergency Use Authorization (EUA). This EUA  will remain  in effect (meaning this test can be used) for the duration of the COVID-19 declaration under Section 564(b)(1) of the Act, 21 U.S.C.section 360bbb-3(b)(1), unless the authorization is terminated  or revoked sooner.       Influenza A by PCR NEGATIVE NEGATIVE Final   Influenza B by PCR NEGATIVE NEGATIVE Final    Comment: (NOTE) The Xpert Xpress SARS-CoV-2/FLU/RSV plus assay is intended as an aid in the diagnosis of influenza from Nasopharyngeal swab specimens and should not be used as a sole basis for treatment. Nasal washings and aspirates are unacceptable for Xpert Xpress SARS-CoV-2/FLU/RSV testing.  Fact Sheet for Patients: EntrepreneurPulse.com.au  Fact Sheet for Healthcare Providers: IncredibleEmployment.be  This test is not yet approved or cleared by the [place] FDA and has been authorized for detection and/or diagnosis of SARS-CoV-2 by FDA under an Emergency Use Authorization (EUA). This EUA will remain in effect (meaning this test can be used) for the duration of the COVID-19 declaration under Section 564(b)(1) of the Act, 21 U.S.C. section 360bbb-3(b)(1), unless the authorization is terminated or revoked.  Performed at [facility], [place] [address] [place], [place] [address]          Radiology Studies: No results found.      Scheduled Meds: . furosemide  80 mg Intravenous Q12H  . LORazepam  0.5 mg Oral Q8H  . sertraline  100 mg Oral QHS  . sodium bicarbonate  1,300 mg Oral BID  . traZODone  100 mg Oral QHS   Continuous Infusions:   LOS: 2 days    Time spent: 30 minutes    Barb Merino, MD [facility] Pager [phone]

## 2020-12-20 DIAGNOSIS — Z992 Dependence on renal dialysis: Secondary | ICD-10-CM

## 2020-12-20 DIAGNOSIS — E8770 Fluid overload, unspecified: Secondary | ICD-10-CM | POA: Diagnosis not present

## 2020-12-20 DIAGNOSIS — N186 End stage renal disease: Secondary | ICD-10-CM | POA: Diagnosis not present

## 2020-12-20 DIAGNOSIS — R0600 Dyspnea, unspecified: Secondary | ICD-10-CM

## 2020-12-20 DIAGNOSIS — Z515 Encounter for palliative care: Principal | ICD-10-CM

## 2020-12-20 DIAGNOSIS — R601 Generalized edema: Secondary | ICD-10-CM | POA: Diagnosis not present

## 2020-12-20 MED ORDER — TRAZODONE HCL 100 MG PO TABS: 100.0000 mg | ORAL_TABLET | Freq: Every day | ORAL | Status: AC

## 2020-12-20 MED ORDER — FUROSEMIDE 80 MG PO TABS: 80.0000 mg | ORAL_TABLET | Freq: Two times a day (BID) | ORAL | Status: AC

## 2020-12-20 NOTE — Progress Notes (Signed)
OT Cancellation Note  Patient Details Name: Amy Herrera MRN: [id number] DOB: [birth year]   Cancelled Treatment:    Reason Eval/Treat Not Completed: OT screened, no needs identified, will sign off;Other (comment). Plans to d/c to residential hospice [facility] today  Britt Bottom 12/20/2020, 10:12 AM

## 2020-12-20 NOTE — Progress Notes (Signed)
Discharge instructions given to patient Amy Herrera and sister Ledell Peoples. Let them know also that pick up time for patient will be around 9 o clock tonight. Both verbalized understanding.

## 2020-12-20 NOTE — TOC Transition Note (Signed)
Transition of Care [facility]) - CM/SW Discharge Note   Patient Details  Name: Amy Herrera MRN: [id number] Date of Birth: 24-Feb-1951  [facility] CM/SW Contact:  Marilu Favre, RN Phone Number: 12/20/2020, 1:16 PM   Clinical Narrative:      Family has completed consents for [facility]. [facility] requesting transport at 9 pm. NCM called [facility] and paperwork in chart.      RN to call report to  [phone].     Patient Goals and CMS Choice        Discharge Placement                       Discharge Plan and Services                                     Social Determinants of Health (SDOH) Interventions     Readmission Risk Interventions No flowsheet data found.

## 2020-12-20 NOTE — Care Management Important Message (Signed)
Important Message  Patient Details  Name: Amy Herrera MRN: [id number] Date of Birth: January 28, 1951   Medicare Important Message Given:  Yes  Patient at end of life out of respect no IM given.      Teila Skalsky 12/20/2020, 2:40 PM

## 2020-12-20 NOTE — Discharge Summary (Signed)
Physician Discharge Summary  Amy Herrera [id number] DOB: 1951/02/02 DOA: 12/16/2020  PCP: Maximiano Coss, NP  Admit date: 12/16/2020 Discharge date: 12/20/2020  Admitted From: Home Disposition: Hospice  Discharge Condition: Hospice CODE STATUS: DNR Diet recommendation: Regular diet   Brief/Hospital Summary:  70 year old female with history of advanced kidney disease stage V , chronic leg edema, anemia, GERD, hypertension and type 2 diabetes presented to the emergency room with shortness of breath.  Patient lives alone.  She has a kidney disease for a long time and followed up with [facility]. 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  She was managing her symptoms at home.  Her sister is her closest person.  For last 3 weeks or more, her shortness of breath is worsening, she knows that this is because of her kidneys, she is also having worsening swelling of the legs.  She was trying to stay home and not come to the hospital, however symptoms were too much and she could not function at home so came to the emergency room asking for comfort measures.  In the emergency room, she was in obvious distress and tachypneic.  She has edema of the legs.  On initial interview, she was alert oriented and was expressing her wishes that she is looking forward for hospice level of care.  Found with obvious fluid overload.  Chest x-ray with pulmonary vascular congestion. She had expressed desire for comfort care and hospice, needed hospitalization for symptom control so hospitalist was consulted.  5/28, symptomatic treatment and comfort care initiated. 5/29-30, patient more awake, symptom well-controlled with oral pain medications.  Feels comfortable.  No repeat labs are done per patient's wishes and waiting for inpatient hospice bed.  5/31: Patient discharged to hospice facility; stable for transport.  Discharge  Diagnoses:  Principal Problem:   End of life care Active Problems:   T2DM (type 2 diabetes mellitus) ([place])   Fluid overload   CKD (chronic kidney disease), stage V (HCC)   Anemia due to end stage renal disease (HCC)   Anasarca   End stage kidney disease ([place])    Discharge Instructions  Discharge Instructions    Diet - low sodium heart healthy   Complete by: As directed      Allergies as of 12/20/2020      Reactions   Sulfa Antibiotics Swelling   Face/mouth   Codeine Hives      Medication List    STOP taking these medications   aspirin 81 MG tablet   atorvastatin 20 MG tablet Commonly known as: LIPITOR   carvedilol 25 MG tablet Commonly known as: COREG   hydrALAZINE 100 MG tablet Commonly known as: APRESOLINE   hydrOXYzine 10 MG tablet Commonly known as: ATARAX/VISTARIL   pramipexole 0.5 MG tablet Commonly known as: MIRAPEX   repaglinide 0.5 MG tablet Commonly known as: PRANDIN   sodium bicarbonate 650 MG tablet     TAKE these medications   furosemide 80 MG tablet Commonly known as: Lasix Take 1 tablet (80 mg total) by mouth 2 (two) times daily. What changed:   how much to take  when to take this   sertraline 100 MG tablet Commonly known as: ZOLOFT TAKE 1 TABLET BY MOUTH EVERYDAY AT BEDTIME What changed: See the new instructions.   traZODone 100 MG tablet Commonly known as: DESYREL Take 1 tablet (100 mg total) by mouth at bedtime.  Allergies  Allergen Reactions  . Sulfa Antibiotics Swelling    Face/mouth  . Codeine Hives    Consultations:  Palliative care   Procedures/Studies: DG Chest 1 View  Result Date: 12/16/2020 CLINICAL DATA:  Increasing shortness of breath for several days, swelling EXAM: CHEST  1 VIEW COMPARISON:  01/23/2018 FINDINGS: Single frontal view of the chest demonstrates borderline enlargement the cardiac silhouette. There is increased central vascular congestion without focal airspace disease, effusion, or  pneumothorax. No acute bony abnormalities. IMPRESSION: 1. Increased central vascular congestion without overt edema. Electronically Signed   By: Randa Ngo M.D.   On: 12/16/2020 23:20      Subjective: Some dyspnea after having a bath. No wheezing. Improves with oxygen use.  Discharge Exam: Vitals:   12/19/20 0404 12/20/20 0402  BP: (!) 161/70 (!) 147/57  Pulse: 77 75  Resp: 17 17  Temp: 98.3 F (36.8 C) 97.6 F (36.4 C)  SpO2: 100% 98%   Vitals:   12/18/20 0522 12/18/20 1436 12/19/20 0404 12/20/20 0402  BP: 130/67 130/62 (!) 161/70 (!) 147/57  Pulse: 84 77 77 75  Resp: '17 16 17 17  '$ Temp: 98.4 F (36.9 C) 98.1 F (36.7 C) 98.3 F (36.8 C) 97.6 F (36.4 C)  TempSrc: Oral Oral Oral Oral  SpO2: 100% 100% 100% 98%  Weight:      Height:        General: Pt is alert, awake, not in acute distress Cardiovascular: RRR, S1/S2 +, no rubs, no gallops Respiratory: CTA bilaterally, no wheezing, no rhonchi, some tachypnea Abdominal: Soft, NT, ND, bowel sounds + Extremities: no edema, no cyanosis    The results of significant diagnostics from this hospitalization (including imaging, microbiology, ancillary and laboratory) are listed below for reference.     Microbiology: Recent Results (from the past 240 hour(s))  Resp Panel by RT-PCR (Flu A&B, Covid) Nasopharyngeal Swab     Status: None   Collection Time: 12/16/20 11:37 PM   Specimen: Nasopharyngeal Swab; Nasopharyngeal(NP) swabs in vial transport medium  Result Value Ref Range Status   SARS Coronavirus 2 by RT PCR NEGATIVE NEGATIVE Final    Comment: (NOTE) SARS-CoV-2 target nucleic acids are NOT DETECTED.  The SARS-CoV-2 RNA is generally detectable in upper respiratory specimens during the acute phase of infection. The lowest concentration of SARS-CoV-2 viral copies this assay can detect is 138 copies/mL. A negative result does not preclude SARS-Cov-2 infection and should not be used as the sole basis for treatment  or other patient management decisions. A negative result may occur with  improper specimen collection/handling, submission of specimen other than nasopharyngeal swab, presence of viral mutation(s) within the areas targeted by this assay, and inadequate number of viral copies(<138 copies/mL). A negative result must be combined with clinical observations, patient history, and epidemiological information. The expected result is Negative.  Fact Sheet for Patients:  EntrepreneurPulse.com.au  Fact Sheet for Healthcare Providers:  IncredibleEmployment.be  This test is no t yet approved or cleared by the [place] FDA and  has been authorized for detection and/or diagnosis of SARS-CoV-2 by FDA under an Emergency Use Authorization (EUA). This EUA will remain  in effect (meaning this test can be used) for the duration of the COVID-19 declaration under Section 564(b)(1) of the Act, 21 U.S.C.section 360bbb-3(b)(1), unless the authorization is terminated  or revoked sooner.       Influenza A by PCR NEGATIVE NEGATIVE Final   Influenza B by PCR NEGATIVE NEGATIVE Final    Comment: (  NOTE) The Xpert Xpress SARS-CoV-2/FLU/RSV plus assay is intended as an aid in the diagnosis of influenza from Nasopharyngeal swab specimens and should not be used as a sole basis for treatment. Nasal washings and aspirates are unacceptable for Xpert Xpress SARS-CoV-2/FLU/RSV testing.  Fact Sheet for Patients: EntrepreneurPulse.com.au  Fact Sheet for Healthcare Providers: IncredibleEmployment.be  This test is not yet approved or cleared by the [place] FDA and has been authorized for detection and/or diagnosis of SARS-CoV-2 by FDA under an Emergency Use Authorization (EUA). This EUA will remain in effect (meaning this test can be used) for the duration of the COVID-19 declaration under Section 564(b)(1) of the Act, 21 U.S.C. section  360bbb-3(b)(1), unless the authorization is terminated or revoked.  Performed at [facility], [place] [address] [place], [place] [address]      Labs: BNP (last 3 results) No results for input(s): BNP in the last 8760 hours. Basic Metabolic Panel: Recent Labs  Lab 12/16/20 2314 12/17/20 0500  NA 139 140  K 5.7* 6.3*  CL 113* 116*  CO2 14* 14*  GLUCOSE 147* 148*  BUN 81* 83*  CREATININE 7.56* 7.89*  CALCIUM 5.8* 6.0*   Liver Function Tests: Recent Labs  Lab 12/17/20 0500  AST 22  ALT 25  ALKPHOS 51  BILITOT 0.5  PROT 6.1*  ALBUMIN 2.4*   No results for input(s): LIPASE, AMYLASE in the last 168 hours. No results for input(s): AMMONIA in the last 168 hours. CBC: Recent Labs  Lab 12/16/20 2314 12/17/20 0257  WBC 9.3 10.1  NEUTROABS 8.0*  --   HGB 6.5* 7.0*  HCT 22.9* 24.9*  MCV 85.4 86.8  PLT 257 259   Cardiac Enzymes: No results for input(s): CKTOTAL, CKMB, CKMBINDEX, TROPONINI in the last 168 hours. BNP: Invalid input(s): POCBNP CBG: No results for input(s): GLUCAP in the last 168 hours. D-Dimer No results for input(s): DDIMER in the last 72 hours. Hgb A1c No results for input(s): HGBA1C in the last 72 hours. Lipid Profile No results for input(s): CHOL, HDL, LDLCALC, TRIG, CHOLHDL, LDLDIRECT in the last 72 hours. Thyroid function studies No results for input(s): TSH, T4TOTAL, T3FREE, THYROIDAB in the last 72 hours.  Invalid input(s): FREET3 Anemia work up No results for input(s): VITAMINB12, FOLATE, FERRITIN, TIBC, IRON, RETICCTPCT in the last 72 hours. Urinalysis    Component Value Date/Time   COLORURINE YELLOW 01/23/2018 0740   APPEARANCEUR CLEAR 01/23/2018 0740   LABSPEC 1.013 01/23/2018 0740   PHURINE 5.0 01/23/2018 0740   GLUCOSEU 50 (A) 01/23/2018 0740   HGBUR NEGATIVE 01/23/2018 0740   BILIRUBINUR NEGATIVE 01/23/2018 0740   KETONESUR NEGATIVE 01/23/2018 0740   PROTEINUR 100 (A) 01/23/2018 0740   NITRITE NEGATIVE 01/23/2018 0740    LEUKOCYTESUR SMALL (A) 01/23/2018 0740   Sepsis Labs Invalid input(s): PROCALCITONIN,  WBC,  LACTICIDVEN Microbiology Recent Results (from the past 240 hour(s))  Resp Panel by RT-PCR (Flu A&B, Covid) Nasopharyngeal Swab     Status: None   Collection Time: 12/16/20 11:37 PM   Specimen: Nasopharyngeal Swab; Nasopharyngeal(NP) swabs in vial transport medium  Result Value Ref Range Status   SARS Coronavirus 2 by RT PCR NEGATIVE NEGATIVE Final    Comment: (NOTE) SARS-CoV-2 target nucleic acids are NOT DETECTED.  The SARS-CoV-2 RNA is generally detectable in upper respiratory specimens during the acute phase of infection. The lowest concentration of SARS-CoV-2 viral copies this assay can detect is 138 copies/mL. A negative result does not preclude SARS-Cov-2 infection and should not  be used as the sole basis for treatment or other patient management decisions. A negative result may occur with  improper specimen collection/handling, submission of specimen other than nasopharyngeal swab, presence of viral mutation(s) within the areas targeted by this assay, and inadequate number of viral copies(<138 copies/mL). A negative result must be combined with clinical observations, patient history, and epidemiological information. The expected result is Negative.  Fact Sheet for Patients:  EntrepreneurPulse.com.au  Fact Sheet for Healthcare Providers:  IncredibleEmployment.be  This test is no t yet approved or cleared by the [place] FDA and  has been authorized for detection and/or diagnosis of SARS-CoV-2 by FDA under an Emergency Use Authorization (EUA). This EUA will remain  in effect (meaning this test can be used) for the duration of the COVID-19 declaration under Section 564(b)(1) of the Act, 21 U.S.C.section 360bbb-3(b)(1), unless the authorization is terminated  or revoked sooner.       Influenza A by PCR NEGATIVE NEGATIVE Final    Influenza B by PCR NEGATIVE NEGATIVE Final    Comment: (NOTE) The Xpert Xpress SARS-CoV-2/FLU/RSV plus assay is intended as an aid in the diagnosis of influenza from Nasopharyngeal swab specimens and should not be used as a sole basis for treatment. Nasal washings and aspirates are unacceptable for Xpert Xpress SARS-CoV-2/FLU/RSV testing.  Fact Sheet for Patients: EntrepreneurPulse.com.au  Fact Sheet for Healthcare Providers: IncredibleEmployment.be  This test is not yet approved or cleared by the [place] FDA and has been authorized for detection and/or diagnosis of SARS-CoV-2 by FDA under an Emergency Use Authorization (EUA). This EUA will remain in effect (meaning this test can be used) for the duration of the COVID-19 declaration under Section 564(b)(1) of the Act, 21 U.S.C. section 360bbb-3(b)(1), unless the authorization is terminated or revoked.  Performed at [facility], [place] [address] [place], [place] [address]      Time coordinating discharge: 35 minutes  SIGNED:   Cordelia Poche, MD [facility] 12/20/2020, 1:33 PM

## 2020-12-20 NOTE — Progress Notes (Signed)
Trudi Ida to be D/C'd  per MD order. Discussed with the patient and all questions fully answered.  IV catheter discontinued intact. Foley cath still in place.  An After Visit Summary was printed and given to [facility] for receiving facility. Pt escorted via [facility].  [place] called and updated about pts departure from hospital.

## 2020-12-20 NOTE — Progress Notes (Signed)
Daily Progress Note   Patient Name: Amy Herrera       Date: 12/20/2020 DOB: Sep 23, 1950  Age: 70 y.o. MRN#: [id number] Attending Physician: Mariel Aloe, MD Primary Care Physician: Maximiano Coss, NP Admit Date: 12/16/2020  Reason for Consultation/Follow-up: end of life care, symptom management  Subjective: Patient appears comfortable. Eating dinner. She denies pain. She does report dyspnea - I provided education that pain medication (dilaudid) will help relieve her dyspnea, and that she can ask her RN it as needed.  Her respirations are even and unlabored. No excessive respiratory secretions noted.     Length of Stay: 3  Current Medications: Scheduled Meds:  . furosemide  80 mg Intravenous Q12H  . LORazepam  0.5 mg Oral Q8H  . sertraline  100 mg Oral QHS  . sodium bicarbonate  1,300 mg Oral BID  . traZODone  100 mg Oral QHS     PRN Meds: acetaminophen **OR** acetaminophen, albuterol, haloperidol **OR** haloperidol **OR** haloperidol lactate, HYDROmorphone (DILAUDID) injection, LORazepam **OR** LORazepam **OR** LORazepam, ondansetron **OR** ondansetron (ZOFRAN) IV, oxyCODONE          Vital Signs: BP (!) 147/57 (BP Location: Left Arm)   Pulse 75   Temp 97.6 F (36.4 C) (Oral)   Resp 17   Ht '5\' 5"'$  (1.651 m)   Wt 90.7 kg   SpO2 98%   BMI 33.28 kg/m  SpO2: SpO2: 98 % O2 Device: O2 Device: Nasal Cannula O2 Flow Rate: O2 Flow Rate (L/min): 2 L/min  Intake/output summary:   Intake/Output Summary (Last 24 hours) at 12/20/2020 1705 Last data filed at 12/20/2020 1300 Gross per 24 hour  Intake 420 ml  Output 1700 ml  Net -1280 ml   LBM:   Baseline Weight: Weight: 90.7 kg Most recent weight: Weight: 90.7 kg       Palliative Assessment/Data: PPS 20%      Patient  Active Problem List   Diagnosis Date Noted  . Fluid overload 12/17/2020  . CKD (chronic kidney disease), stage V ([place]) 12/17/2020  . Anemia due to end stage renal disease ([place]) 12/17/2020  . Anasarca 12/17/2020  . End stage kidney disease ([place]) 12/17/2020  . End of life care 12/17/2020  . T2DM (type 2 diabetes mellitus) ([place]) 02/06/2019  . Generalized anxiety disorder 02/06/2019  . Chronic kidney  disease (CKD), stage IV (severe) ([place]) 06/06/2018    Palliative Care Assessment & Plan   HPI/Patient Profile:69 y.o.femalewith past medical history of chronic kidney disease stage V (baseline creatinine around 6), type 2 DM, anemia, and hypertension who presented to the emergency departmenton 5/27/2022with worsening shortness of breath over the past week.Patient also reporting significant peripheral edema and abdominal distention.  ED Course: Labs show potassium 5.7, creatinine 7.56, and hemoglobin 6.5. Patient initially stated she does not want dialysis but is okay with medical treatment such as IV lasix and blood transfusions if required. She stated if she does not improve she would want to be transitioned to comfort measures. Lasix 80 mg IV every 12 hours ordered along with Lokelma for hyperkalemia.  Patient admitted to [facility] for further management of fluid overload, anasarca, and progressive renal failure.   Assessment: - end stage renal disease - anasarca - fluid overload - end of life care  Recommendations/Plan:  Continue full comfort measures  DNR/DNI as previously documented  Dilaudid 0.5 mg every 2 hours PRN pain or dyspnea  Provide frequent assessments and administer PRN medications as clinically necessary to ensure EOL comfort  Pending transfer to [facility] today  Goals of Care and Additional Recommendations:  Limitations on Scope of Treatment: Full Comfort Care  Code Status: DNR  Prognosis:   < 2 weeks  Discharge Planning:  Hospice  facility   Thank you for allowing the Palliative Medicine Team to assist in the care of this patient.   Total Time 15 minutes Prolonged Time Billed  no       Greater than 50%  of this time was spent counseling and coordinating care related to the above assessment and plan.  Lavena Bullion, NP  Please contact Palliative Medicine Team phone at [phone] for questions and concerns.

## 2020-12-20 NOTE — Progress Notes (Signed)
[facility] ([facility]) Hospital Liaison note.     This patient is approved to transfer to [facility] today.    [facility] will notify TOC when registration paperwork has been completed to arrange transport.    RN please call report to [phone].   Thank you,     Mary Anne Robertson, RN, CCM       [facility] Hospital Liaison  [phone] 

## 2020-12-20 NOTE — Progress Notes (Signed)
Report called to [facility] place [phone]). Spoke with Elmyra Ricks, RN

## 2021-01-20 DEATH — deceased

## 2021-02-12 ENCOUNTER — Other Ambulatory Visit: Payer: Self-pay | Admitting: Registered Nurse

## 2021-02-12 DIAGNOSIS — N184 Chronic kidney disease, stage 4 (severe): Secondary | ICD-10-CM

## 2021-02-12 DIAGNOSIS — E1122 Type 2 diabetes mellitus with diabetic chronic kidney disease: Secondary | ICD-10-CM

## 2021-03-03 ENCOUNTER — Other Ambulatory Visit: Payer: Self-pay | Admitting: Registered Nurse

## 2021-03-03 DIAGNOSIS — E1122 Type 2 diabetes mellitus with diabetic chronic kidney disease: Secondary | ICD-10-CM

## 2021-03-03 DIAGNOSIS — N184 Chronic kidney disease, stage 4 (severe): Secondary | ICD-10-CM

## 2021-11-22 IMAGING — DX DG CHEST 1V
1 series · 1 of 1 positions shown · non-contrast
Comparison: 01/23/2018

CLINICAL DATA: Increasing shortness of breath for several days,
swelling

EXAM:
CHEST  1 VIEW

[chest ap]
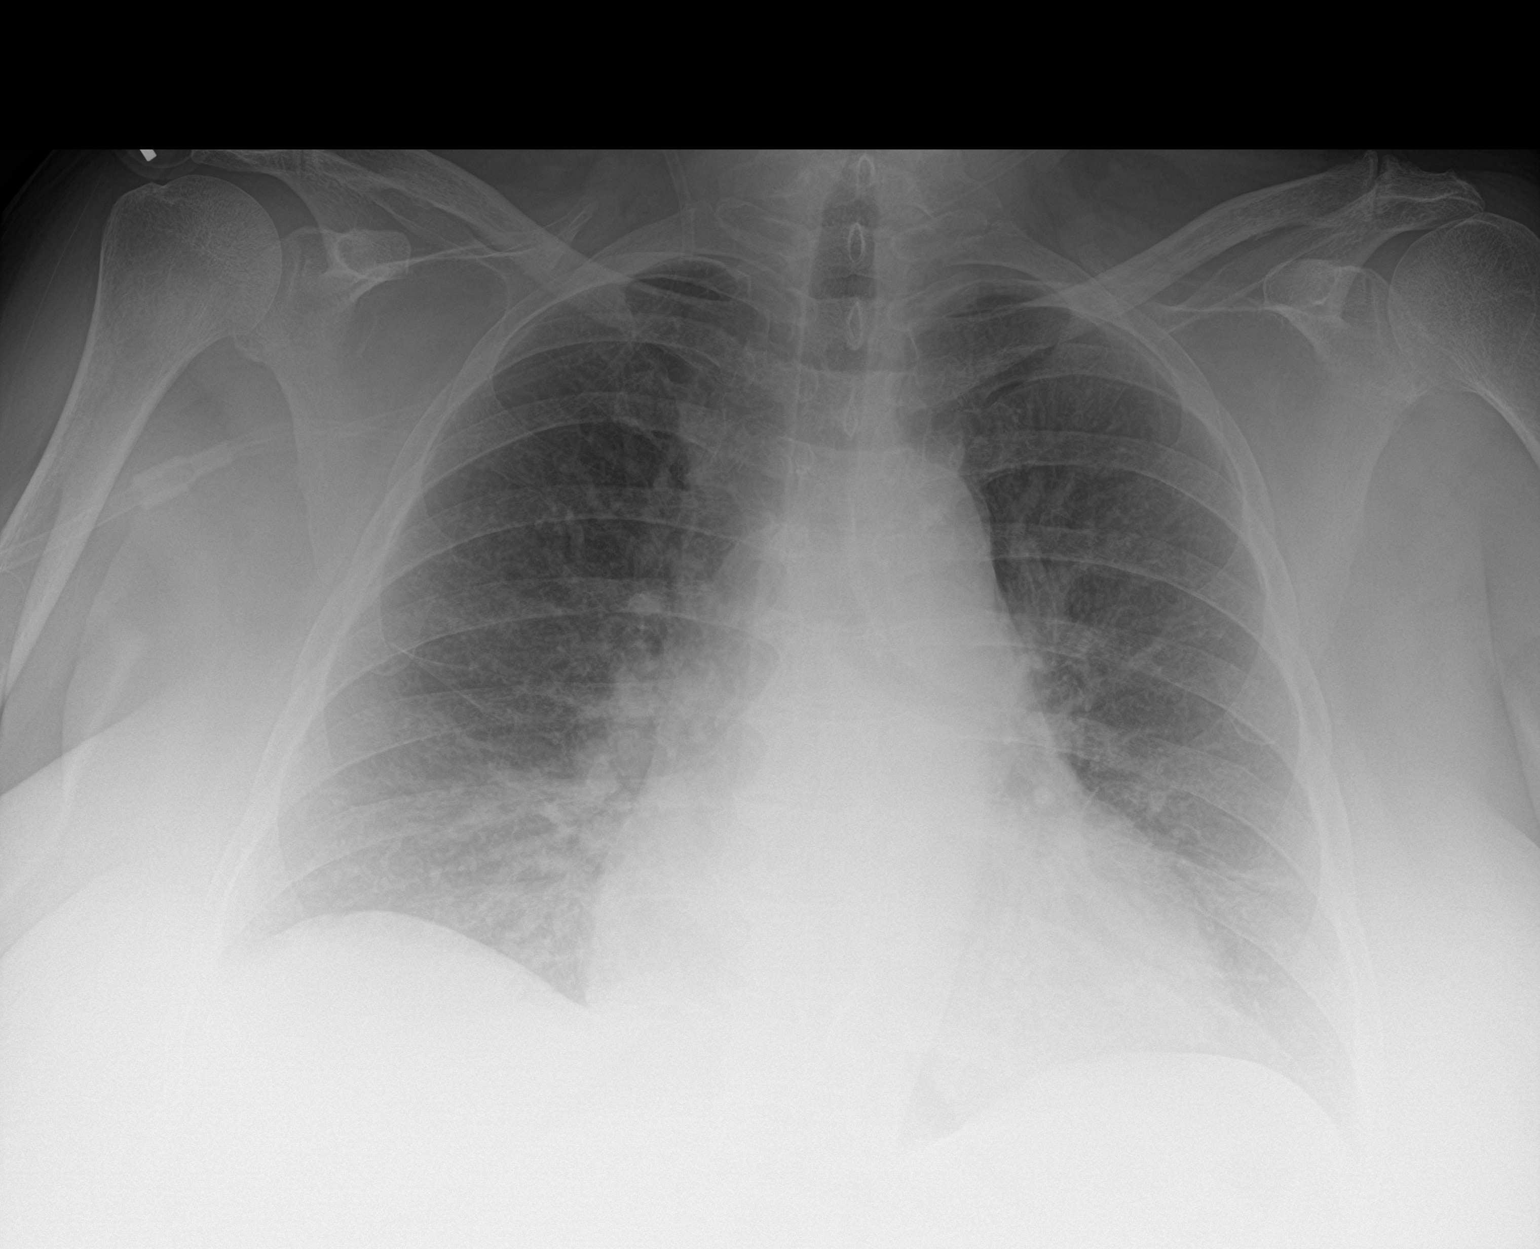

[1 of 1 positions shown; findings below may reference images not displayed]

FINDINGS: Single frontal view of the chest demonstrates borderline enlargement
the cardiac silhouette. There is increased central vascular
congestion without focal airspace disease, effusion, or
pneumothorax. No acute bony abnormalities.
IMPRESSION: 1. Increased central vascular congestion without overt edema.
